# Patient Record
Sex: Female | Born: 1945 | Race: White | Smoking: Former smoker
Health system: Southern US, Community
[De-identification: ages and names within clinical notes are randomized; demographics above are authoritative.]

## PROBLEM LIST (undated history)

## (undated) DIAGNOSIS — N816 Rectocele: Secondary | ICD-10-CM

## (undated) DIAGNOSIS — L0232 Furuncle of buttock: Secondary | ICD-10-CM

## (undated) DIAGNOSIS — T8859XA Other complications of anesthesia, initial encounter: Secondary | ICD-10-CM

## (undated) DIAGNOSIS — Z972 Presence of dental prosthetic device (complete) (partial): Secondary | ICD-10-CM

## (undated) DIAGNOSIS — I10 Essential (primary) hypertension: Secondary | ICD-10-CM

## (undated) DIAGNOSIS — M545 Other chronic pain: Secondary | ICD-10-CM

## (undated) DIAGNOSIS — R7303 Prediabetes: Secondary | ICD-10-CM

## (undated) DIAGNOSIS — T4145XA Adverse effect of unspecified anesthetic, initial encounter: Secondary | ICD-10-CM

## (undated) DIAGNOSIS — K08109 Complete loss of teeth, unspecified cause, unspecified class: Secondary | ICD-10-CM

## (undated) DIAGNOSIS — G609 Hereditary and idiopathic neuropathy, unspecified: Secondary | ICD-10-CM

## (undated) DIAGNOSIS — K449 Diaphragmatic hernia without obstruction or gangrene: Secondary | ICD-10-CM

## (undated) DIAGNOSIS — G8929 Other chronic pain: Secondary | ICD-10-CM

## (undated) DIAGNOSIS — Z8711 Personal history of peptic ulcer disease: Secondary | ICD-10-CM

## (undated) DIAGNOSIS — M109 Gout, unspecified: Secondary | ICD-10-CM

## (undated) DIAGNOSIS — Z8719 Personal history of other diseases of the digestive system: Secondary | ICD-10-CM

## (undated) DIAGNOSIS — K219 Gastro-esophageal reflux disease without esophagitis: Secondary | ICD-10-CM

## (undated) DIAGNOSIS — M1990 Unspecified osteoarthritis, unspecified site: Secondary | ICD-10-CM

## (undated) DIAGNOSIS — Z96659 Presence of unspecified artificial knee joint: Secondary | ICD-10-CM

## (undated) DIAGNOSIS — Z8679 Personal history of other diseases of the circulatory system: Secondary | ICD-10-CM

## (undated) HISTORY — PX: BREAST ENHANCEMENT SURGERY: SHX7

## (undated) HISTORY — PX: CARDIAC CATHETERIZATION: SHX172

## (undated) HISTORY — PX: TONSILLECTOMY: SUR1361

## (undated) HISTORY — PX: TUBAL LIGATION: SHX77

## (undated) HISTORY — PX: OTHER SURGICAL HISTORY: SHX169

## (undated) HISTORY — PX: HERNIA REPAIR: SHX51

## (undated) HISTORY — PX: TOTAL KNEE ARTHROPLASTY: SHX125

## (undated) HISTORY — PX: NASAL SINUS SURGERY: SHX719

## (undated) HISTORY — DX: Presence of unspecified artificial knee joint: Z96.659

## (undated) HISTORY — PX: CHOLECYSTECTOMY: SHX55

## (undated) HISTORY — PX: ROTATOR CUFF REPAIR: SHX139

## (undated) HISTORY — PX: VESICOVAGINAL FISTULA CLOSURE W/ TAH: SUR271

## (undated) HISTORY — PX: VAGINAL HYSTERECTOMY: SUR661

## (undated) HISTORY — PX: KNEE ARTHROSCOPY: SUR90

## (undated) HISTORY — PX: BLADDER SURGERY: SHX569

---
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
## 1968-01-18 HISTORY — PX: APPENDECTOMY: SHX54

## 1974-01-17 HISTORY — PX: CHOLECYSTECTOMY OPEN: SUR202

## 1983-01-18 HISTORY — PX: NASAL SINUS SURGERY: SHX719

## 1994-01-17 HISTORY — PX: UMBILICAL HERNIA REPAIR: SHX196

## 1997-01-17 HISTORY — PX: COMBINED AUGMENTATION MAMMAPLASTY AND ABDOMINOPLASTY: SUR291

## 2009-01-17 HISTORY — PX: BLADDER SURGERY: SHX569

## 2011-01-18 HISTORY — PX: INCONTINENCE SURGERY: SHX676

## 2013-09-25 DIAGNOSIS — K5900 Constipation, unspecified: Secondary | ICD-10-CM | POA: Insufficient documentation

## 2013-09-25 DIAGNOSIS — R079 Chest pain, unspecified: Secondary | ICD-10-CM | POA: Insufficient documentation

## 2013-09-25 DIAGNOSIS — M25519 Pain in unspecified shoulder: Secondary | ICD-10-CM | POA: Insufficient documentation

## 2013-09-25 DIAGNOSIS — IMO0001 Reserved for inherently not codable concepts without codable children: Secondary | ICD-10-CM | POA: Insufficient documentation

## 2013-09-25 DIAGNOSIS — E785 Hyperlipidemia, unspecified: Secondary | ICD-10-CM | POA: Insufficient documentation

## 2013-09-25 DIAGNOSIS — I2510 Atherosclerotic heart disease of native coronary artery without angina pectoris: Secondary | ICD-10-CM | POA: Insufficient documentation

## 2013-09-25 DIAGNOSIS — L509 Urticaria, unspecified: Secondary | ICD-10-CM | POA: Insufficient documentation

## 2013-09-25 DIAGNOSIS — K219 Gastro-esophageal reflux disease without esophagitis: Secondary | ICD-10-CM | POA: Insufficient documentation

## 2013-09-25 DIAGNOSIS — K589 Irritable bowel syndrome without diarrhea: Secondary | ICD-10-CM | POA: Insufficient documentation

## 2013-09-25 DIAGNOSIS — M1990 Unspecified osteoarthritis, unspecified site: Secondary | ICD-10-CM | POA: Insufficient documentation

## 2013-09-25 DIAGNOSIS — R197 Diarrhea, unspecified: Secondary | ICD-10-CM | POA: Insufficient documentation

## 2013-09-25 DIAGNOSIS — R159 Full incontinence of feces: Secondary | ICD-10-CM | POA: Insufficient documentation

## 2013-09-25 DIAGNOSIS — R7303 Prediabetes: Secondary | ICD-10-CM | POA: Insufficient documentation

## 2013-09-25 DIAGNOSIS — M25569 Pain in unspecified knee: Secondary | ICD-10-CM | POA: Insufficient documentation

## 2013-09-25 DIAGNOSIS — E663 Overweight: Secondary | ICD-10-CM | POA: Insufficient documentation

## 2013-09-25 DIAGNOSIS — M79603 Pain in arm, unspecified: Secondary | ICD-10-CM | POA: Insufficient documentation

## 2013-09-25 DIAGNOSIS — F32A Depression, unspecified: Secondary | ICD-10-CM | POA: Insufficient documentation

## 2013-09-25 DIAGNOSIS — E669 Obesity, unspecified: Secondary | ICD-10-CM | POA: Insufficient documentation

## 2013-09-25 DIAGNOSIS — N3281 Overactive bladder: Secondary | ICD-10-CM | POA: Insufficient documentation

## 2013-09-25 DIAGNOSIS — F329 Major depressive disorder, single episode, unspecified: Secondary | ICD-10-CM | POA: Insufficient documentation

## 2014-10-30 DIAGNOSIS — S83209A Unspecified tear of unspecified meniscus, current injury, unspecified knee, initial encounter: Secondary | ICD-10-CM | POA: Insufficient documentation

## 2014-10-30 DIAGNOSIS — Z01818 Encounter for other preprocedural examination: Secondary | ICD-10-CM | POA: Insufficient documentation

## 2014-11-18 DIAGNOSIS — Z4889 Encounter for other specified surgical aftercare: Secondary | ICD-10-CM | POA: Insufficient documentation

## 2015-03-09 ENCOUNTER — Encounter: Payer: Self-pay | Admitting: Internal Medicine

## 2015-03-09 ENCOUNTER — Ambulatory Visit (INDEPENDENT_AMBULATORY_CARE_PROVIDER_SITE_OTHER): Payer: Medicare Other | Admitting: Internal Medicine

## 2015-03-09 VITALS — BP 110/64 | HR 94 | Temp 97.4°F | Resp 20 | Ht 66.0 in | Wt 219.8 lb

## 2015-03-09 DIAGNOSIS — R21 Rash and other nonspecific skin eruption: Secondary | ICD-10-CM | POA: Diagnosis not present

## 2015-03-09 DIAGNOSIS — L500 Allergic urticaria: Secondary | ICD-10-CM | POA: Diagnosis not present

## 2015-03-09 MED ORDER — NYSTATIN 100000 UNIT/GM EX CREA: TOPICAL_CREAM | CUTANEOUS | Status: DC

## 2015-03-09 NOTE — Assessment & Plan Note (Signed)
   Possible metal allergy  Patch test placed today.  Please return in 48 and 96 hours for readings.  No antihistamines during test.

## 2015-03-09 NOTE — Progress Notes (Signed)
Referring provider: Gwenith Daily. Wyline Mood, MD [address] [facility] [address], [place] [address]  History of Present Illness:  Morgan Pearson is a 70 y.o. female seen in consultation at the kind request of Dr. Wyline Mood for possible metal allergy  HPI Comments: Possible metal allergy: Patient is scheduled to undergo total knee replacement in the near future. In the past, contact with nickel jewelry has caused an erythematous pruritic rash in the area of contact. She has undergone left knee replacement in 2010 without any problems but she does not know what type of metal was in the implant. Last October, she underwent knee arthroscopic 8 and developed erythema and edema in the area of the incision. She was told that it was due to infection. Fluid was drained and symptoms eventually resolved. Of note, patient has also undergone dental implants without any problems.   No current outpatient prescriptions on file prior to visit.   No current facility-administered medications on file prior to visit.    Assessment and Plan: Allergic urticaria  Possible metal allergy  Patch test placed today.  Please return in 48 and 96 hours for readings.  No antihistamines during test.  Rash  Start nystatin cream. Apply 3 times a day to affected area for 7-10 days    Return in about 2 days (around 03/11/2015).  Meds ordered this encounter  Medications  . atorvastatin (LIPITOR) 40 MG tablet    Sig: Take 40 mg by mouth.  Marland Kitchen aspirin 325 MG tablet    Sig: Take 81 mg by mouth.  . cyclobenzaprine (FLEXERIL) 10 MG tablet    Sig: Take 10 mg by mouth.  . diclofenac (VOLTAREN) 75 MG EC tablet    Sig:   . Vitamin D, Ergocalciferol, (DRISDOL) 50000 units CAPS capsule    Sig: Take by mouth.  Marland Kitchen FLUoxetine (PROZAC) 20 MG capsule    Sig:   . LORazepam (ATIVAN) 1 MG tablet    Sig:   . omeprazole (PRILOSEC) 40 MG capsule    Sig: Take 40 mg by mouth.  . oxyCODONE-acetaminophen  (PERCOCET/ROXICET) 5-325 MG tablet    Sig: Take by mouth.  . phentermine (ADIPEX-P) 37.5 MG tablet    Sig:   . triamterene-hydrochlorothiazide (MAXZIDE-25) 37.5-25 MG tablet    Sig:   . etodolac (LODINE) 400 MG tablet    Sig:   . fluticasone (FLONASE) 50 MCG/ACT nasal spray    Sig:   . nystatin cream (MYCOSTATIN)    Sig: APPLY TO AFFECTED AREAS THREE TIMES A DAY FOR 7 TO 10 DAYS.    Dispense:  30 g    Refill:  0    Physical Exam: BP 110/64 mmHg  Pulse 94  Temp(Src) 97.4 F (36.3 C) (Oral)  Resp 20  Ht  (1.676 m)  Wt 219 lb 12.8 oz (99.701 kg)  BMI 35.49 kg/m2   Physical Exam  Constitutional: She appears well-developed and well-nourished. No distress.  HENT:  Right Ear: External ear normal.  Left Ear: External ear normal.  Nose: Nose normal.  Mouth/Throat: Oropharynx is clear and moist.  Eyes: Conjunctivae are normal. Right eye exhibits no discharge. Left eye exhibits no discharge.  Cardiovascular: Normal rate, regular rhythm and normal heart sounds.   No murmur heard. Pulmonary/Chest: Effort normal and breath sounds normal. No respiratory distress. She has no wheezes. She has no rales.  Abdominal: Soft. Bowel sounds are normal.  Musculoskeletal: She exhibits no edema.  Lymphadenopathy:    She has no  cervical adenopathy.  Neurological: She is alert.  Skin: Rash (erythematous moist-looking rash on her left axillary region) noted.  Vitals reviewed.   Review of systems: Per HPI unless specifically indicated below Review of Systems  Constitutional: Negative for fever, chills, appetite change and unexpected weight change.  HENT: Negative for congestion, ear pain, postnasal drip, rhinorrhea, sinus pressure, sneezing and sore throat.   Eyes: Negative for pain and itching.  Respiratory: Negative for cough, chest tightness and wheezing.   Cardiovascular: Negative for chest pain and leg swelling.  Gastrointestinal: Negative for vomiting and diarrhea.  Genitourinary:  Negative for difficulty urinating.  Musculoskeletal: Positive for arthralgias. Negative for joint swelling.  Skin: Positive for rash.  Allergic/Immunologic: Negative for environmental allergies, food allergies and immunocompromised state.       No hymenoptera stings No latex allergy  Neurological: Negative for seizures.    Past medical history:  Patient Active Problem List   Diagnosis Date Noted  . Allergic urticaria 03/09/2015  . Rash 03/09/2015  . Encounter for surgical follow-up care 11/18/2014  . Knee torn cartilage 10/30/2014  . Encounter for other preprocedural examination 10/30/2014  . Arm pain 09/25/2013  . Abnormal uterine contraction 09/25/2013  . Chest pain 09/25/2013  . CN (constipation) 09/25/2013  . Arteriosclerosis of coronary artery 09/25/2013  . Clinical depression 09/25/2013  . 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 D (diarrhea) 09/25/2013  . Can't get food down 09/25/2013  . Encopresis 09/25/2013  . Acid reflux 09/25/2013  . HLD (hyperlipidemia) 09/25/2013  . Adaptive colitis 09/25/2013  . Gonalgia 09/25/2013  . Arthralgia of shoulder 09/25/2013  . Adiposity 09/25/2013  . Arthritis, degenerative 09/25/2013  . Excess weight 09/25/2013  . Borderline diabetes mellitus 09/25/2013  . Hive 09/25/2013    Past surgical history: Past Surgical History  Procedure Laterality Date  . Knee arthroscopy    . Total knee      Family history: Family History  Problem Relation Age of Onset  . Allergic rhinitis Neg Hx   . Angioedema Neg Hx   . Asthma Neg Hx   . Eczema Neg Hx   . Immunodeficiency Neg Hx   . Urticaria Neg Hx     Environmental/Social history: She lives in a house that is 70 years of age she has a non-feather pillow and comforter, she has central air conditioning and heating, there are no pets in the home, there is none finished basement in the home, she smoked less than a pack per day for 20 years but quit 30 years ago, she is retired.  Drug  Allergies:  Allergies  Allergen Reactions  . Other     TAPE 1"X5YD TAPE  . Tape Dermatitis    Thank you for the opportunity to care for this patient.  Please do not hesitate to contact me with questions.

## 2015-03-09 NOTE — Patient Instructions (Addendum)
Allergic urticaria  Possible metal allergy  Patch test placed today.  Please return in 48 and 96 hours for readings.  No antihistamines during test.  Rash  Start nystatin cream. Apply 3 times a day to affected area for 7-10 days

## 2015-03-09 NOTE — Assessment & Plan Note (Signed)
   Start nystatin cream. Apply 3 times a day to affected area for 7-10 days

## 2015-03-11 ENCOUNTER — Encounter: Payer: Medicare Other | Admitting: Internal Medicine

## 2015-03-11 NOTE — Progress Notes (Signed)
This encounter was created in error - please disregard.

## 2015-03-13 ENCOUNTER — Encounter: Payer: Medicare Other | Admitting: Internal Medicine

## 2015-03-13 NOTE — Progress Notes (Signed)
Patient presented at 48 hours and 96 hours for readings of her patch test. They were all negative

## 2015-03-16 ENCOUNTER — Encounter: Payer: Self-pay | Admitting: *Deleted

## 2015-03-19 ENCOUNTER — Encounter: Payer: Self-pay | Admitting: *Deleted

## 2015-12-18 ENCOUNTER — Encounter (HOSPITAL_COMMUNITY): Payer: Self-pay | Admitting: Emergency Medicine

## 2015-12-18 ENCOUNTER — Emergency Department (HOSPITAL_COMMUNITY)
Admission: EM | Admit: 2015-12-18 | Discharge: 2015-12-19 | Disposition: A | Discharge status: Home / Self Care | Payer: Medicare Other | Attending: Emergency Medicine | Admitting: Emergency Medicine

## 2015-12-18 ENCOUNTER — Emergency Department (HOSPITAL_COMMUNITY): Payer: Medicare Other

## 2015-12-18 DIAGNOSIS — Z7982 Long term (current) use of aspirin: Secondary | ICD-10-CM | POA: Diagnosis not present

## 2015-12-18 DIAGNOSIS — M545 Low back pain, unspecified: Secondary | ICD-10-CM

## 2015-12-18 DIAGNOSIS — S50811A Abrasion of right forearm, initial encounter: Secondary | ICD-10-CM | POA: Diagnosis not present

## 2015-12-18 DIAGNOSIS — W1839XA Other fall on same level, initial encounter: Secondary | ICD-10-CM | POA: Diagnosis not present

## 2015-12-18 DIAGNOSIS — S5012XA Contusion of left forearm, initial encounter: Secondary | ICD-10-CM | POA: Diagnosis not present

## 2015-12-18 DIAGNOSIS — R51 Headache: Secondary | ICD-10-CM | POA: Diagnosis not present

## 2015-12-18 DIAGNOSIS — Y939 Activity, unspecified: Secondary | ICD-10-CM | POA: Diagnosis not present

## 2015-12-18 DIAGNOSIS — Z96659 Presence of unspecified artificial knee joint: Secondary | ICD-10-CM | POA: Insufficient documentation

## 2015-12-18 DIAGNOSIS — S40212A Abrasion of left shoulder, initial encounter: Secondary | ICD-10-CM | POA: Insufficient documentation

## 2015-12-18 DIAGNOSIS — Y999 Unspecified external cause status: Secondary | ICD-10-CM | POA: Insufficient documentation

## 2015-12-18 DIAGNOSIS — R0789 Other chest pain: Secondary | ICD-10-CM

## 2015-12-18 DIAGNOSIS — Y929 Unspecified place or not applicable: Secondary | ICD-10-CM | POA: Diagnosis not present

## 2015-12-18 DIAGNOSIS — Z87891 Personal history of nicotine dependence: Secondary | ICD-10-CM | POA: Insufficient documentation

## 2015-12-18 DIAGNOSIS — Z79899 Other long term (current) drug therapy: Secondary | ICD-10-CM | POA: Diagnosis not present

## 2015-12-18 DIAGNOSIS — R519 Headache, unspecified: Secondary | ICD-10-CM

## 2015-12-18 DIAGNOSIS — W19XXXA Unspecified fall, initial encounter: Secondary | ICD-10-CM

## 2015-12-18 DIAGNOSIS — S59912A Unspecified injury of left forearm, initial encounter: Secondary | ICD-10-CM | POA: Diagnosis present

## 2015-12-18 DIAGNOSIS — M25512 Pain in left shoulder: Secondary | ICD-10-CM

## 2015-12-18 LAB — I-STAT CHEM 8, ED
BUN: 17 mg/dL (ref 6–20)
CALCIUM ION: 1.17 mmol/L (ref 1.15–1.40)
Chloride: 103 mmol/L (ref 101–111)
Creatinine, Ser: 1.5 mg/dL — ABNORMAL HIGH (ref 0.44–1.00)
GLUCOSE: 113 mg/dL — AB (ref 65–99)
HCT: 36 % (ref 36.0–46.0)
HEMOGLOBIN: 12.2 g/dL (ref 12.0–15.0)
Potassium: 4 mmol/L (ref 3.5–5.1)
SODIUM: 143 mmol/L (ref 135–145)
TCO2: 27 mmol/L (ref 0–100)

## 2015-12-18 NOTE — ED Triage Notes (Signed)
Per Pt, from UC, sent here for head CT due to fall with Pos LOC. Pt not on blood thinners. Had xray done of her L forearm which was negative. Pt is ambulatory without any distress. C/o slight nausea. Headache 8/10.

## 2015-12-19 ENCOUNTER — Emergency Department (HOSPITAL_COMMUNITY): Payer: Medicare Other

## 2015-12-19 MED ORDER — CYCLOBENZAPRINE HCL 5 MG PO TABS: 5.0000 mg | ORAL_TABLET | Freq: Three times a day (TID) | ORAL | 0 refills | Status: DC | PRN

## 2015-12-19 MED ORDER — ONDANSETRON 4 MG PO TBDP: 4.0000 mg | ORAL_TABLET | Freq: Three times a day (TID) | ORAL | 0 refills | Status: DC | PRN

## 2015-12-19 MED ORDER — ACETAMINOPHEN 500 MG PO TABS
1000.0000 mg | ORAL_TABLET | Freq: Once | ORAL | Status: AC
  Administered 2015-12-19: 1000 mg via ORAL

## 2015-12-19 MED ORDER — ACETAMINOPHEN 500 MG PO TABS
ORAL_TABLET | ORAL | Status: AC
  Filled 2015-12-19: qty 2

## 2015-12-19 NOTE — ED Provider Notes (Signed)
[facility]-EMERGENCY DEPT Provider Note   CSN: [id number] Arrival date & time: 12/18/15  2123  By signing my name below, I, Suzan SlickAshley N. Elon SpannerLeger, attest that this documentation has been prepared under the direction and in the presence of Alvira MondayErin Lilo Wallington, MD.  Electronically Signed: Suzan SlickAshley N. Elon SpannerLeger, ED Scribe. 12/19/15. 1:28 AM.    History   Chief Complaint Chief Complaint  Patient presents with  . Fall  . Loss of Consciousness   The history is provided by the patient. No language interpreter was used.    HPI Comments: Breck CoonsVictoria Borgerding is a 70 y.o. female who presents to the Emergency Department here after a fall sustained at approximately 4:30 PM. Pt states she stumbled, lost her balance resulting in her hitting the cement. She admits to an episode of LOC at time of fall but states she is unsure how long she was down. She now reports constant, ongoing HA, L forearm pain arm pain, lower back pain, chest wall pain, and nausea. Currently pain is rated 7/10. Pt reports ongoing gait issues x 2 weeks as she states she has often felt off balance. She also reports numbness to toes bilaterally. OTC ASA attempted at home without any long term improvement. Denies any recent fever, chills, vomiting, chest pain, or shortness of breath. Pt was initially evaluated at Urgent Care and was advised to come to the Emergency Department for further evaluation.  PCP: Karle PlumberARVIND,MOOGALI M, MD   Past Medical History:  Diagnosis Date  . Total knee replacement status     Patient Active Problem List   Diagnosis Date Noted  . Allergic urticaria 03/09/2015  . Rash 03/09/2015  . Encounter for surgical follow-up care 11/18/2014  . Knee torn cartilage 10/30/2014  . Encounter for other preprocedural examination 10/30/2014  . Arm pain 09/25/2013  . Abnormal uterine contraction 09/25/2013  . Chest pain 09/25/2013  . CN (constipation) 09/25/2013  . Arteriosclerosis of coronary artery 09/25/2013  . Clinical depression  09/25/2013  . 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 D (diarrhea) 09/25/2013  . Can't get food down 09/25/2013  . Encopresis 09/25/2013  . Acid reflux 09/25/2013  . HLD (hyperlipidemia) 09/25/2013  . Adaptive colitis 09/25/2013  . Gonalgia 09/25/2013  . Arthralgia of shoulder 09/25/2013  . Adiposity 09/25/2013  . Arthritis, degenerative 09/25/2013  . Excess weight 09/25/2013  . Borderline diabetes mellitus 09/25/2013  . Hive 09/25/2013    Past Surgical History:  Procedure Laterality Date  . BLADDER SURGERY    . BREAST ENHANCEMENT SURGERY    . CHOLECYSTECTOMY    . HERNIA REPAIR    . KNEE ARTHROSCOPY    . LAZER SURGERY    . NASAL SINUS SURGERY    . ROTATOR CUFF REPAIR    . total knee    . TUMMY TUCK    . VESICOVAGINAL FISTULA CLOSURE W/ TAH      OB History    No data available       Home Medications    Prior to Admission medications   Medication Sig Start Date End Date Taking? Authorizing Provider  aspirin EC 81 MG tablet Take 81 mg by mouth daily.   Yes Historical Provider, MD  atorvastatin (LIPITOR) 40 MG tablet Take 40 mg by mouth daily.    Yes Historical Provider, MD  Cholecalciferol (VITAMIN D PO) Take 1 tablet by mouth daily.   Yes Historical Provider, MD  FLUoxetine (PROZAC) 20 MG capsule Take 20 mg by mouth daily.  09/26/14  Yes Historical Provider, MD  HYDROcodone-acetaminophen (  NORCO/VICODIN) 5-325 MG tablet Take 1 tablet by mouth every 6 (six) hours as needed for moderate pain.  09/28/15  Yes Historical Provider, MD  omeprazole (PRILOSEC) 40 MG capsule Take 40 mg by mouth daily.    Yes Historical Provider, MD  triamterene-hydrochlorothiazide (MAXZIDE-25) 37.5-25 MG tablet Take 1 tablet by mouth daily.  12/21/14  Yes Historical Provider, MD  cyclobenzaprine (FLEXERIL) 5 MG tablet Take 1 tablet (5 mg total) by mouth 3 (three) times daily as needed for muscle spasms. 12/19/15   Alvira Monday, MD  nystatin cream (MYCOSTATIN) APPLY TO AFFECTED AREAS THREE TIMES A DAY  FOR 7 TO 10 DAYS. Patient not taking: Reported on 12/19/2015 03/09/15   Mikki Santee, MD  ondansetron (ZOFRAN ODT) 4 MG disintegrating tablet Take 1 tablet (4 mg total) by mouth every 8 (eight) hours as needed for nausea or vomiting. 12/19/15   Alvira Monday, MD    Family History Family History  Problem Relation Age of Onset  . Allergic rhinitis Neg Hx   . Angioedema Neg Hx   . Asthma Neg Hx   . Eczema Neg Hx   . Immunodeficiency Neg Hx   . Urticaria Neg Hx     Social History Social History  Substance Use Topics  . Smoking status: Former Smoker    Types: Cigarettes    Quit date: 03/08/1985  . Smokeless tobacco: Never Used  . Alcohol use No     Allergies   Other and Tape   Review of Systems Review of Systems  Constitutional: Negative for chills and fever.  HENT: Negative for sore throat.   Eyes: Negative for visual disturbance.  Respiratory: Negative for cough and shortness of breath.   Cardiovascular: Negative for chest pain.  Gastrointestinal: Positive for nausea. Negative for abdominal pain and vomiting.  Genitourinary: Negative for difficulty urinating.  Musculoskeletal: Positive for arthralgias and back pain. Negative for neck pain.  Skin: Positive for wound. Negative for rash.  Neurological: Positive for numbness and headaches. Negative for syncope, facial asymmetry, speech difficulty and weakness.     Physical Exam Updated Vital Signs BP 119/60 (BP Location: Right Arm) Comment: Simultaneous filing. User may not have seen previous data.  Pulse 69 Comment: Simultaneous filing. User may not have seen previous data.  Temp 98.7 F (37.1 C) (Oral)   Resp 16   Ht 5\' 6"  (1.676 m)   Wt 218 lb (98.9 kg)   SpO2 97% Comment: Simultaneous filing. User may not have seen previous data.  BMI 35.19 kg/m   Physical Exam  Constitutional: She is oriented to person, place, and time. She appears well-developed and well-nourished. No distress.  HENT:  Head: Normocephalic  and atraumatic.  Eyes: Conjunctivae and EOM are normal.  Neck: Normal range of motion.  Cardiovascular: Normal rate, regular rhythm, normal heart sounds and intact distal pulses.  Exam reveals no gallop and no friction rub.   No murmur heard. Pulmonary/Chest: Effort normal and breath sounds normal. No respiratory distress. She has no wheezes. She has no rales. She exhibits tenderness.  Abdominal: Soft. She exhibits no distension. There is no tenderness. There is no guarding.  Musculoskeletal: Normal range of motion. She exhibits tenderness. She exhibits no edema or deformity.  Abrasion over L shoulder. Contusion L forearm. 2+ pulses. Superficial scratch to the R forearm. No cervical, thoracic tenderness. Does report tenderness over lumbar spine  Neurological: She is alert and oriented to person, place, and time.  Altered sensation bilateral toes Normal gait Normal strength CN nerves  tested and normal Normal finger-nose  Skin: Skin is warm and dry. No rash noted. She is not diaphoretic. No erythema.  Psychiatric: She has a normal mood and affect. Judgment normal.  Nursing note and vitals reviewed.    ED Treatments / Results   DIAGNOSTIC STUDIES: Oxygen Saturation is 96% on RA, adequate by my interpretation.    COORDINATION OF CARE: 1:20 AM- Will order imaging and blood work. Discussed treatment plan with pt at bedside and pt agreed to plan.     Labs (all labs ordered are listed, but only abnormal results are displayed) Labs Reviewed  I-STAT CHEM 8, ED - Abnormal; Notable for the following:       Result Value   Creatinine, Ser 1.50 (*)    Glucose, Bld 113 (*)    All other components within normal limits    EKG  EKG Interpretation None       Radiology Dg Ribs Unilateral W/chest Left  Result Date: 12/19/2015 CLINICAL DATA:  Initial valuation for acute trauma, fall. Left upper anterior rib pain. EXAM: LEFT RIBS AND CHEST - 3+ VIEW COMPARISON:  Prior radiograph from  01/24/2014. FINDINGS: Cardiac and mediastinal silhouettes are stable in size and contour, and remain within normal limits. Lungs are normally inflated. No focal infiltrates. No pulmonary edema or pleural effusion. No pneumothorax. Mild diffuse bronchitic changes. Dedicated views the left ribs demonstrate no acute displaced rib fracture. No other acute osseous abnormality. Degenerative changes noted about the partially visualized shoulders. IMPRESSION: 1. No acute displaced rib fracture identified. 2. Mild diffuse bronchitic changes. No other active cardiopulmonary disease. Electronically Signed   By: Rise Mu M.D.   On: 12/19/2015 03:06   Dg Lumbar Spine Complete  Result Date: 12/19/2015 CLINICAL DATA:  Initial evaluation for acute trauma, fall. EXAM: LUMBAR SPINE - COMPLETE 4+ VIEW COMPARISON:  None. FINDINGS: Vertebral bodies normally aligned with preservation of the normal lumbar lordosis. Question mild height loss at the superior endplate of L3, favored to be chronic/degenerative in nature. Vertebral body heights are otherwise maintained. Moderate multilevel degenerative spondylolysis, greatest at L5-S1. Bilateral facet arthrosis noted throughout the lumbar spine. No acute soft tissue abnormality.  Aortic atherosclerosis noted. IMPRESSION: 1. Mild height loss at the superior endplate of L3, favored to be chronic/degenerative in nature. Correlation with physical exam for possible pain at this location recommended. Finding could be further assessed with dedicated cross-sectional imaging of the lumbar spine as indicated. 2. No other radiographic evidence for acute traumatic injury within the lumbar spine. 3. Moderate multilevel degenerative spondylolysis, greatest at L5-S1. Electronically Signed   By: Rise Mu M.D.   On: 12/19/2015 03:17   Ct Head Wo Contrast  Result Date: 12/18/2015 CLINICAL DATA:  Initial evaluation for acute trauma, fall. EXAM: CT HEAD WITHOUT CONTRAST CT CERVICAL  SPINE WITHOUT CONTRAST TECHNIQUE: Multidetector CT imaging of the head and cervical spine was performed following the standard protocol without intravenous contrast. Multiplanar CT image reconstructions of the cervical spine were also generated. COMPARISON:  Prior CT from 01/24/2014. FINDINGS: CT HEAD FINDINGS Brain: Mild age-related cerebral atrophy with chronic microvascular ischemic disease. Probable small remote lacunar infarcts noted within the bilateral basal ganglia. No acute intracranial hemorrhage. No findings to suggest acute large vessel territory infarct. No mass lesion, midline shift, or mass effect. No hydrocephalus. No extra-axial fluid collection. Vascular: Scattered vascular calcifications noted within the carotid siphons. No hyperdense vessel. Skull: Small left periorbital contusion noted. No acute calvarial fracture. Sinuses/Orbits: Globes and orbital soft tissues within  normal limits. Paranasal sinuses are clear. No mastoid effusion. CT CERVICAL SPINE FINDINGS Alignment: Vertebral bodies normally aligned with preservation of the normal cervical lordosis. No listhesis is subluxation. Skull base and vertebrae: Skullbase intact. Normal C1-2 articulations preserved. Dens is intact. Vertebral body heights maintained. No acute fracture. Soft tissues and spinal canal: Visualized soft tissues of the neck demonstrate no acute abnormality. No prevertebral soft tissue swelling. Spinal canal widely patent. Disc levels: Moderate degenerative spondylolysis present at C5-6. Degenerative changes noted about the anterior C1-2 articulation. Upper chest: Visualized upper mediastinum within normal limits. Visualized lung apices are clear. No apical pneumothorax. IMPRESSION: CT BRAIN: 1. No acute intracranial process. 2. Mild age-related cerebral atrophy with chronic small vessel ischemic disease. CT CERVICAL SPINE: 1. No acute traumatic injury within the cervical spine. 2. Moderate degenerative spondylolysis at  C5-6. Electronically Signed   By: Rise MuBenjamin  McClintock M.D.   On: 12/18/2015 22:57   Ct Cervical Spine Wo Contrast  Result Date: 12/18/2015 CLINICAL DATA:  Initial evaluation for acute trauma, fall. EXAM: CT HEAD WITHOUT CONTRAST CT CERVICAL SPINE WITHOUT CONTRAST TECHNIQUE: Multidetector CT imaging of the head and cervical spine was performed following the standard protocol without intravenous contrast. Multiplanar CT image reconstructions of the cervical spine were also generated. COMPARISON:  Prior CT from 01/24/2014. FINDINGS: CT HEAD FINDINGS Brain: Mild age-related cerebral atrophy with chronic microvascular ischemic disease. Probable small remote lacunar infarcts noted within the bilateral basal ganglia. No acute intracranial hemorrhage. No findings to suggest acute large vessel territory infarct. No mass lesion, midline shift, or mass effect. No hydrocephalus. No extra-axial fluid collection. Vascular: Scattered vascular calcifications noted within the carotid siphons. No hyperdense vessel. Skull: Small left periorbital contusion noted. No acute calvarial fracture. Sinuses/Orbits: Globes and orbital soft tissues within normal limits. Paranasal sinuses are clear. No mastoid effusion. CT CERVICAL SPINE FINDINGS Alignment: Vertebral bodies normally aligned with preservation of the normal cervical lordosis. No listhesis is subluxation. Skull base and vertebrae: Skullbase intact. Normal C1-2 articulations preserved. Dens is intact. Vertebral body heights maintained. No acute fracture. Soft tissues and spinal canal: Visualized soft tissues of the neck demonstrate no acute abnormality. No prevertebral soft tissue swelling. Spinal canal widely patent. Disc levels: Moderate degenerative spondylolysis present at C5-6. Degenerative changes noted about the anterior C1-2 articulation. Upper chest: Visualized upper mediastinum within normal limits. Visualized lung apices are clear. No apical pneumothorax. IMPRESSION:  CT BRAIN: 1. No acute intracranial process. 2. Mild age-related cerebral atrophy with chronic small vessel ischemic disease. CT CERVICAL SPINE: 1. No acute traumatic injury within the cervical spine. 2. Moderate degenerative spondylolysis at C5-6. Electronically Signed   By: Rise MuBenjamin  McClintock M.D.   On: 12/18/2015 22:57   Dg Shoulder Left  Result Date: 12/19/2015 CLINICAL DATA:  Initial evaluation for acute trauma, fall. EXAM: LEFT SHOULDER - 2+ VIEW COMPARISON:  None. FINDINGS: No acute fracture dislocation. Humeral head somewhat high-riding relative to the glenoid, suggestive of underlying rotator cuff pathology. Advanced degenerative changes noted about the glenohumeral joint. AC joint grossly approximated. No acute soft tissue abnormality. Partially calcified left breast implant noted. IMPRESSION: 1. No acute fracture or dislocation. 2. 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 Electronically Signed   By: Rise MuBenjamin  McClintock M.D.   On: 12/19/2015 03:08    Procedures Procedures (including critical care time)  Medications Ordered in ED Medications  acetaminophen (TYLENOL) tablet 1,000 mg (1,000 mg Oral Given 12/19/15 0146)     Initial Impression / Assessment and Plan / ED Course  I have reviewed the triage vital signs and the nursing notes.  Pertinent labs & imaging results that were available during my care of the patient were reviewed by me and considered in my medical decision making (see chart for details).  Clinical Course    70yo female presents with concern for mechanical fall from standing with headache, nausea, back pain.  CT head/CSpine without abnormalities. Chest tenderness on exam, and chest and shoulder XR without acute abnormalities. Discussed possibility of occult fx of ribs however low suspicion. Lumbar spine with slight height loss which may be chronic or acute fx.  Given this finding and tenderness, feel it may represent acute fx. Discussed  possibility of brace in ED however fx not unstable, pt stable for discharge without brace and would like to go home and follow up with spine doctor.  Given rx for flexeril and zofran. Patient discharged in stable condition with understanding of reasons to return.   Final Clinical Impressions(s) / ED Diagnoses   Final diagnoses:  Fall, initial encounter  Acute nonintractable headache, unspecified headache type  Chest wall pain  Acute pain of left shoulder  Contusion of left forearm, initial encounter  Acute left-sided low back pain without sciatica    New Prescriptions Discharge Medication List as of 12/19/2015  4:09 AM    START taking these medications   Details  cyclobenzaprine (FLEXERIL) 5 MG tablet Take 1 tablet (5 mg total) by mouth 3 (three) times daily as needed for muscle spasms., Starting Sat 12/19/2015, Print    ondansetron (ZOFRAN ODT) 4 MG disintegrating tablet Take 1 tablet (4 mg total) by mouth every 8 (eight) hours as needed for nausea or vomiting., Starting Sat 12/19/2015, Print       I personally performed the services described in this documentation, which was scribed in my presence. The recorded information has been reviewed and is accurate.    Alvira Monday, MD 12/19/15 [phone]

## 2015-12-19 NOTE — ED Notes (Signed)
Taken to xray at this time. 

## 2016-01-18 HISTORY — PX: CATARACT EXTRACTION W/ INTRAOCULAR LENS  IMPLANT, BILATERAL: SHX1307

## 2018-04-18 ENCOUNTER — Ambulatory Visit (HOSPITAL_BASED_OUTPATIENT_CLINIC_OR_DEPARTMENT_OTHER): Admit: 2018-04-18 | Payer: Medicare Other | Admitting: Obstetrics

## 2018-04-18 ENCOUNTER — Encounter (HOSPITAL_BASED_OUTPATIENT_CLINIC_OR_DEPARTMENT_OTHER): Payer: Self-pay

## 2018-04-18 SURGERY — COLPORRHAPHY, POSTERIOR, FOR RECTOCELE REPAIR
Anesthesia: General

## 2018-06-26 ENCOUNTER — Other Ambulatory Visit: Payer: Self-pay | Admitting: Obstetrics

## 2018-07-07 ENCOUNTER — Other Ambulatory Visit (HOSPITAL_COMMUNITY)
Admission: RE | Admit: 2018-07-07 | Discharge: 2018-07-07 | Disposition: A | Discharge status: Home / Self Care | Payer: Medicare Other | Source: Ambulatory Visit | Attending: Obstetrics | Admitting: Obstetrics

## 2018-07-07 DIAGNOSIS — Z1159 Encounter for screening for other viral diseases: Secondary | ICD-10-CM | POA: Insufficient documentation

## 2018-07-07 LAB — SARS CORONAVIRUS 2 (TAT 6-24 HRS): SARS Coronavirus 2: NEGATIVE

## 2018-07-09 ENCOUNTER — Other Ambulatory Visit: Payer: Self-pay

## 2018-07-09 ENCOUNTER — Encounter (HOSPITAL_BASED_OUTPATIENT_CLINIC_OR_DEPARTMENT_OTHER): Payer: Self-pay | Admitting: *Deleted

## 2018-07-09 NOTE — Progress Notes (Signed)

## 2018-07-09 NOTE — Progress Notes (Addendum)
Spoke w/ pt via phone for pre-op interview.  Npo after mn w/ exception clear liquids until 0700 thing nothing by mouth.  Arrive at 1115.  Needs cbc, bmp, t&s, and ekg.  Pt had covid test done Saturday 07-07-2018.  Will take allopurinol, prilosec, and prozac am dos w/ sips of water.  Reviewed [facility] guidelines and visitor restriction policy. Pt to bring home medications.  Chart to be reviewed by anesthesia, Konrad Felix PA.  ADDENDUM:  Per anesthesia, Janett Billow, ok to proceed since is followed by pcp and pt denies any chest pain since 2016.

## 2018-07-10 ENCOUNTER — Encounter (HOSPITAL_BASED_OUTPATIENT_CLINIC_OR_DEPARTMENT_OTHER): Payer: Self-pay | Admitting: *Deleted

## 2018-07-11 ENCOUNTER — Observation Stay (HOSPITAL_BASED_OUTPATIENT_CLINIC_OR_DEPARTMENT_OTHER)
Admission: RE | Admit: 2018-07-11 | Discharge: 2018-07-12 | Disposition: A | Discharge status: Home / Self Care | Payer: Medicare Other | Attending: Obstetrics | Admitting: Obstetrics

## 2018-07-11 ENCOUNTER — Encounter (HOSPITAL_BASED_OUTPATIENT_CLINIC_OR_DEPARTMENT_OTHER)
Admission: RE | Disposition: A | Discharge status: Home / Self Care | Payer: Self-pay | Source: Home / Self Care | Attending: Obstetrics

## 2018-07-11 ENCOUNTER — Other Ambulatory Visit: Payer: Self-pay

## 2018-07-11 ENCOUNTER — Ambulatory Visit (HOSPITAL_BASED_OUTPATIENT_CLINIC_OR_DEPARTMENT_OTHER): Payer: Medicare Other | Admitting: Anesthesiology

## 2018-07-11 ENCOUNTER — Encounter (HOSPITAL_BASED_OUTPATIENT_CLINIC_OR_DEPARTMENT_OTHER): Payer: Self-pay | Admitting: Anesthesiology

## 2018-07-11 DIAGNOSIS — I2510 Atherosclerotic heart disease of native coronary artery without angina pectoris: Secondary | ICD-10-CM | POA: Insufficient documentation

## 2018-07-11 DIAGNOSIS — Z87891 Personal history of nicotine dependence: Secondary | ICD-10-CM | POA: Diagnosis not present

## 2018-07-11 DIAGNOSIS — R7303 Prediabetes: Secondary | ICD-10-CM | POA: Insufficient documentation

## 2018-07-11 DIAGNOSIS — Z791 Long term (current) use of non-steroidal anti-inflammatories (NSAID): Secondary | ICD-10-CM | POA: Diagnosis not present

## 2018-07-11 DIAGNOSIS — F329 Major depressive disorder, single episode, unspecified: Secondary | ICD-10-CM | POA: Insufficient documentation

## 2018-07-11 DIAGNOSIS — Z79899 Other long term (current) drug therapy: Secondary | ICD-10-CM | POA: Insufficient documentation

## 2018-07-11 DIAGNOSIS — G8929 Other chronic pain: Secondary | ICD-10-CM | POA: Insufficient documentation

## 2018-07-11 DIAGNOSIS — F1120 Opioid dependence, uncomplicated: Secondary | ICD-10-CM | POA: Insufficient documentation

## 2018-07-11 DIAGNOSIS — M545 Low back pain: Secondary | ICD-10-CM | POA: Diagnosis not present

## 2018-07-11 DIAGNOSIS — Z96653 Presence of artificial knee joint, bilateral: Secondary | ICD-10-CM | POA: Insufficient documentation

## 2018-07-11 DIAGNOSIS — F419 Anxiety disorder, unspecified: Secondary | ICD-10-CM | POA: Insufficient documentation

## 2018-07-11 DIAGNOSIS — I10 Essential (primary) hypertension: Secondary | ICD-10-CM | POA: Insufficient documentation

## 2018-07-11 DIAGNOSIS — M109 Gout, unspecified: Secondary | ICD-10-CM | POA: Insufficient documentation

## 2018-07-11 DIAGNOSIS — K219 Gastro-esophageal reflux disease without esophagitis: Secondary | ICD-10-CM | POA: Diagnosis not present

## 2018-07-11 DIAGNOSIS — N816 Rectocele: Secondary | ICD-10-CM | POA: Diagnosis not present

## 2018-07-11 DIAGNOSIS — Z90710 Acquired absence of both cervix and uterus: Secondary | ICD-10-CM | POA: Diagnosis not present

## 2018-07-11 HISTORY — DX: Unspecified osteoarthritis, unspecified site: M19.90

## 2018-07-11 HISTORY — DX: Other chronic pain: M54.50

## 2018-07-11 HISTORY — DX: Rectocele: N81.6

## 2018-07-11 HISTORY — DX: Other chronic pain: G89.29

## 2018-07-11 HISTORY — DX: Prediabetes: R73.03

## 2018-07-11 HISTORY — PX: RECTOCELE REPAIR: SHX761

## 2018-07-11 HISTORY — DX: Personal history of other diseases of the circulatory system: Z86.79

## 2018-07-11 HISTORY — DX: Essential (primary) hypertension: I10

## 2018-07-11 HISTORY — DX: Gastro-esophageal reflux disease without esophagitis: K21.9

## 2018-07-11 HISTORY — DX: Presence of dental prosthetic device (complete) (partial): Z97.2

## 2018-07-11 HISTORY — DX: Personal history of peptic ulcer disease: Z87.11

## 2018-07-11 HISTORY — DX: Presence of dental prosthetic device (complete) (partial): K08.109

## 2018-07-11 HISTORY — DX: Hereditary and idiopathic neuropathy, unspecified: G60.9

## 2018-07-11 HISTORY — DX: Furuncle of buttock: L02.32

## 2018-07-11 HISTORY — DX: Gout, unspecified: M10.9

## 2018-07-11 HISTORY — DX: Diaphragmatic hernia without obstruction or gangrene: K44.9

## 2018-07-11 HISTORY — DX: Other complications of anesthesia, initial encounter: T88.59XA

## 2018-07-11 LAB — CBC
HCT: 40.9 % (ref 36.0–46.0)
Hemoglobin: 12.5 g/dL (ref 12.0–15.0)
MCH: 28.7 pg (ref 26.0–34.0)
MCHC: 30.6 g/dL (ref 30.0–36.0)
MCV: 93.8 fL (ref 80.0–100.0)
Platelets: 207 10*3/uL (ref 150–400)
RBC: 4.36 MIL/uL (ref 3.87–5.11)
RDW: 14.8 % (ref 11.5–15.5)
WBC: 6.3 10*3/uL (ref 4.0–10.5)
nRBC: 0 % (ref 0.0–0.2)

## 2018-07-11 LAB — BASIC METABOLIC PANEL
Anion gap: 10 (ref 5–15)
BUN: 33 mg/dL — ABNORMAL HIGH (ref 8–23)
CO2: 25 mmol/L (ref 22–32)
Calcium: 9.3 mg/dL (ref 8.9–10.3)
Chloride: 106 mmol/L (ref 98–111)
Creatinine, Ser: 1.01 mg/dL — ABNORMAL HIGH (ref 0.44–1.00)
GFR calc Af Amer: >60 mL/min (ref >60)
GFR calc non Af Amer: 55 mL/min — ABNORMAL LOW (ref >60)
Glucose, Bld: 120 mg/dL — ABNORMAL HIGH (ref 70–99)
Potassium: 4 mmol/L (ref 3.5–5.1)
Sodium: 141 mmol/L (ref 135–145)

## 2018-07-11 LAB — GLUCOSE, CAPILLARY: Glucose-Capillary: 137 mg/dL — ABNORMAL HIGH (ref 70–99)

## 2018-07-11 LAB — TYPE AND SCREEN
ABO/RH(D): A POS
Antibody Screen: NEGATIVE

## 2018-07-11 LAB — ABO/RH: ABO/RH(D): A POS

## 2018-07-11 SURGERY — COLPORRHAPHY, POSTERIOR, FOR RECTOCELE REPAIR
Anesthesia: General | Site: Vagina

## 2018-07-11 MED ORDER — HYDROMORPHONE HCL 1 MG/ML IJ SOLN
INTRAMUSCULAR | Status: AC
  Filled 2018-07-11: qty 1

## 2018-07-11 MED ORDER — LIDOCAINE 2% (20 MG/ML) 5 ML SYRINGE
INTRAMUSCULAR | Status: AC
  Filled 2018-07-11: qty 5

## 2018-07-11 MED ORDER — PROPOFOL 10 MG/ML IV BOLUS
INTRAVENOUS | Status: AC
  Filled 2018-07-11: qty 20

## 2018-07-11 MED ORDER — HYDROMORPHONE HCL 1 MG/ML IJ SOLN
0.2000 mg | INTRAMUSCULAR | Status: DC | PRN
  Administered 2018-07-11: 0.5 mg via INTRAVENOUS
  Filled 2018-07-11: qty 1

## 2018-07-11 MED ORDER — DEXAMETHASONE SODIUM PHOSPHATE 10 MG/ML IJ SOLN
INTRAMUSCULAR | Status: AC
  Filled 2018-07-11: qty 1

## 2018-07-11 MED ORDER — CEFAZOLIN SODIUM-DEXTROSE 2-4 GM/100ML-% IV SOLN
2.0000 g | INTRAVENOUS | Status: AC
  Administered 2018-07-11: 14:00:00 2 g via INTRAVENOUS
  Filled 2018-07-11: qty 100

## 2018-07-11 MED ORDER — ONDANSETRON HCL 4 MG PO TABS
4.0000 mg | ORAL_TABLET | Freq: Four times a day (QID) | ORAL | Status: DC | PRN
  Filled 2018-07-11: qty 1

## 2018-07-11 MED ORDER — DEXAMETHASONE SODIUM PHOSPHATE 10 MG/ML IJ SOLN
INTRAMUSCULAR | Status: DC | PRN
  Administered 2018-07-11: 5 mg via INTRAVENOUS

## 2018-07-11 MED ORDER — FLUOXETINE HCL 20 MG PO CAPS
20.0000 mg | ORAL_CAPSULE | Freq: Every day | ORAL | Status: DC
  Filled 2018-07-11: qty 1

## 2018-07-11 MED ORDER — LACTATED RINGERS IV SOLN
INTRAVENOUS | Status: DC
  Administered 2018-07-11: 50 mL/h via INTRAVENOUS
  Administered 2018-07-11: 16:00:00 1000 mL via INTRAVENOUS
  Filled 2018-07-11: qty 1000

## 2018-07-11 MED ORDER — ONDANSETRON HCL 4 MG/2ML IJ SOLN
4.0000 mg | Freq: Four times a day (QID) | INTRAMUSCULAR | Status: DC | PRN
  Administered 2018-07-11: 16:00:00 4 mg via INTRAVENOUS
  Filled 2018-07-11: qty 2

## 2018-07-11 MED ORDER — ONDANSETRON HCL 4 MG/2ML IJ SOLN
INTRAMUSCULAR | Status: DC | PRN
  Administered 2018-07-11: 4 mg via INTRAVENOUS

## 2018-07-11 MED ORDER — ONDANSETRON HCL 4 MG/2ML IJ SOLN
4.0000 mg | Freq: Once | INTRAMUSCULAR | Status: DC | PRN
  Filled 2018-07-11: qty 2

## 2018-07-11 MED ORDER — FLUORESCEIN SODIUM 10 % IV SOLN
INTRAVENOUS | Status: AC
  Filled 2018-07-11: qty 5

## 2018-07-11 MED ORDER — LIDOCAINE 2% (20 MG/ML) 5 ML SYRINGE
INTRAMUSCULAR | Status: DC | PRN
  Administered 2018-07-11: 60 mg via INTRAVENOUS

## 2018-07-11 MED ORDER — KETOROLAC TROMETHAMINE 30 MG/ML IJ SOLN
INTRAMUSCULAR | Status: AC
  Filled 2018-07-11: qty 1

## 2018-07-11 MED ORDER — OXYCODONE-ACETAMINOPHEN 5-325 MG PO TABS
ORAL_TABLET | ORAL | Status: AC
  Filled 2018-07-11: qty 1

## 2018-07-11 MED ORDER — MENTHOL 3 MG MT LOZG
1.0000 | LOZENGE | OROMUCOSAL | Status: DC | PRN
  Filled 2018-07-11: qty 9

## 2018-07-11 MED ORDER — EPHEDRINE SULFATE-NACL 50-0.9 MG/10ML-% IV SOSY
PREFILLED_SYRINGE | INTRAVENOUS | Status: DC | PRN
  Administered 2018-07-11: 15 mg via INTRAVENOUS
  Administered 2018-07-11: 10 mg via INTRAVENOUS

## 2018-07-11 MED ORDER — OXYCODONE-ACETAMINOPHEN 5-325 MG PO TABS
1.0000 | ORAL_TABLET | ORAL | Status: DC | PRN
  Administered 2018-07-11 (×2): 1 via ORAL
  Administered 2018-07-12 (×2): 2 via ORAL
  Filled 2018-07-11: qty 2

## 2018-07-11 MED ORDER — FENTANYL CITRATE (PF) 100 MCG/2ML IJ SOLN
25.0000 ug | INTRAMUSCULAR | Status: DC | PRN
  Administered 2018-07-11 (×2): 25 ug via INTRAVENOUS
  Filled 2018-07-11: qty 1

## 2018-07-11 MED ORDER — PROPOFOL 10 MG/ML IV BOLUS
INTRAVENOUS | Status: DC | PRN
  Administered 2018-07-11: 150 mg via INTRAVENOUS

## 2018-07-11 MED ORDER — ESTRADIOL 0.1 MG/GM VA CREA
TOPICAL_CREAM | VAGINAL | Status: DC | PRN
  Administered 2018-07-11: 1 via VAGINAL

## 2018-07-11 MED ORDER — FENTANYL CITRATE (PF) 100 MCG/2ML IJ SOLN
INTRAMUSCULAR | Status: DC | PRN
  Administered 2018-07-11 (×2): 50 ug via INTRAVENOUS

## 2018-07-11 MED ORDER — ONDANSETRON HCL 4 MG/2ML IJ SOLN
INTRAMUSCULAR | Status: AC
  Filled 2018-07-11: qty 2

## 2018-07-11 MED ORDER — FENTANYL CITRATE (PF) 100 MCG/2ML IJ SOLN
INTRAMUSCULAR | Status: AC
  Filled 2018-07-11: qty 2

## 2018-07-11 MED ORDER — CEFAZOLIN SODIUM-DEXTROSE 2-4 GM/100ML-% IV SOLN
INTRAVENOUS | Status: AC
  Filled 2018-07-11: qty 100

## 2018-07-11 MED ORDER — HYDROCODONE-ACETAMINOPHEN 5-325 MG PO TABS
1.0000 | ORAL_TABLET | Freq: Four times a day (QID) | ORAL | Status: DC | PRN
  Filled 2018-07-11: qty 1

## 2018-07-11 MED ORDER — GUAIFENESIN 100 MG/5ML PO SOLN
15.0000 mL | ORAL | Status: DC | PRN
  Filled 2018-07-11: qty 15

## 2018-07-11 MED ORDER — PANTOPRAZOLE SODIUM 40 MG PO TBEC
40.0000 mg | DELAYED_RELEASE_TABLET | Freq: Every day | ORAL | Status: DC
  Filled 2018-07-11: qty 1

## 2018-07-11 MED ORDER — BUPIVACAINE-EPINEPHRINE 0.5% -1:200000 IJ SOLN
INTRAMUSCULAR | Status: DC | PRN
  Administered 2018-07-11: 20 mL

## 2018-07-11 MED ORDER — KETAMINE HCL 10 MG/ML IJ SOLN
INTRAMUSCULAR | Status: DC | PRN
  Administered 2018-07-11: 25 mg via INTRAVENOUS

## 2018-07-11 MED ORDER — ALLOPURINOL 100 MG PO TABS
100.0000 mg | ORAL_TABLET | Freq: Every day | ORAL | Status: DC
  Filled 2018-07-11: qty 1

## 2018-07-11 MED ORDER — EPHEDRINE 5 MG/ML INJ
INTRAVENOUS | Status: AC
  Filled 2018-07-11: qty 10

## 2018-07-11 SURGICAL SUPPLY — 27 items
BNDG GAUZE ELAST 4 BULKY (GAUZE/BANDAGES/DRESSINGS) ×3 IMPLANT
CANISTER SUCT 3000ML PPV (MISCELLANEOUS) ×3 IMPLANT
DECANTER SPIKE VIAL GLASS SM (MISCELLANEOUS) ×3 IMPLANT
DEVICE CAPIO SLIM SINGLE (INSTRUMENTS) IMPLANT
GAUZE PACKING 1 X5 YD ST (GAUZE/BANDAGES/DRESSINGS) IMPLANT
GLOVE BIO SURGEON STRL SZ 6.5 (GLOVE) ×10 IMPLANT
GLOVE BIO SURGEONS STRL SZ 6.5 (GLOVE) ×5
GLOVE BIOGEL PI IND STRL 7.0 (GLOVE) ×5 IMPLANT
GLOVE BIOGEL PI INDICATOR 7.0 (GLOVE) ×10
GOWN STRL REUS W/ TWL LRG LVL3 (GOWN DISPOSABLE) ×4 IMPLANT
GOWN STRL REUS W/TWL LRG LVL3 (GOWN DISPOSABLE) ×8
HIBICLENS CHG 4% 4OZ BTL (MISCELLANEOUS) IMPLANT
HOLDER FOLEY CATH W/STRAP (MISCELLANEOUS) ×3 IMPLANT
KIT TURNOVER KIT B (KITS) ×3 IMPLANT
NEEDLE HYPO 22GX1.5 SAFETY (NEEDLE) IMPLANT
NS IRRIG 1000ML POUR BTL (IV SOLUTION) ×3 IMPLANT
PACK VAGINAL WOMENS (CUSTOM PROCEDURE TRAY) ×3 IMPLANT
SURGILUBE 2OZ TUBE FLIPTOP (MISCELLANEOUS) ×3 IMPLANT
SUT CAPIO ETHIBPND (SUTURE) IMPLANT
SUT VIC AB 0 UR5 27 (SUTURE) ×3 IMPLANT
SUT VIC AB 2-0 CT1 (SUTURE) ×12 IMPLANT
SUT VIC AB 2-0 CT1 27 (SUTURE)
SUT VIC AB 2-0 CT1 TAPERPNT 27 (SUTURE) IMPLANT
SUT VIC AB 2-0 UR5 27 (SUTURE) IMPLANT
SYR 10ML LL (SYRINGE) IMPLANT
SYR BULB IRRIGATION 50ML (SYRINGE) ×3 IMPLANT
TRAY FOLEY W/BAG SLVR 14FR (SET/KITS/TRAYS/PACK) ×3 IMPLANT

## 2018-07-11 NOTE — Transfer of Care (Signed)
Immediate Anesthesia Transfer of Care Note  Patient: Morgan Pearson  Procedure(s) Performed: POSTERIOR REPAIR (RECTOCELE) (N/A Vagina )  Patient Location: PACU  Anesthesia Type:General  Level of Consciousness: awake, alert , oriented and patient cooperative  Airway & Oxygen Therapy: Patient Spontanous Breathing and Patient connected to nasal cannula oxygen  Post-op Assessment: Report given to RN and Post -op Vital signs reviewed and stable  Post vital signs: Reviewed and stable  Last Vitals:  Vitals Value Taken Time  BP    Temp    Pulse    Resp    SpO2      Last Pain:  Vitals:   07/11/18 1200  TempSrc:   PainSc: 7       Patients Stated Pain Goal: 6 ([id number])  Complications: No apparent anesthesia complications

## 2018-07-11 NOTE — H&P (Signed)
Chief complaint: Rectocele  History of present illness: 73 year old G4, P4 status post vaginal hysterectomy and prior anterior repair with mesh presents with worsening vaginal bulge.  Patient notes difficult to see having bowel movements and needing to splint.  Patient notes palpable vaginal bulge that is not always able to be manually replaced.  This causes significant stress and discomfort to the patient.  Patient also notes urinary frequency with incomplete emptying.  Patient has continued symptoms and unable to have a properly fitting pessary, either due to pain or continued expulsion.  Patient has had several different pessaries and none provide a satisfactory solution.  Past Medical History:  Diagnosis Date  . Boil of buttock    07-09-2018  per pt bilateral buttock,  healed  . Borderline diabetes    followed by pcp,  watches diet  . Chronic low back pain   . Complication of anesthesia    per pt hx hypotension post op in 1970s surgery , but no issues since  . Full dentures   . GERD (gastroesophageal reflux disease)   . Gout    07-09-2018  per pt last flare 2018  great toes  . Hiatal hernia   . History of angina    07-09-2018  per pt hx angina few yrs ago last episode w/ severe hypertension approx. 2016, saw cardiologist @[facility] in [facility], work-up done was told heart was fine no cause found for angina,; has not seen cardiologist since and no episode of angina since 2016  . History of gastric ulcer 2004 approx.  . Hypertension   . Idiopathic neuropathy    feet  . OA (osteoarthritis)    hands  . Rectocele     Past Surgical History:  Procedure Laterality Date  . APPENDECTOMY  1970  . BLADDER SURGERY  2011   cystocele and enterocele repairs  . CARDIAC CATHETERIZATION  2010  @ [facility]   mild nonobstructive cad  . CATARACT EXTRACTION W/ INTRAOCULAR LENS  IMPLANT, BILATERAL  2018  . CHOLECYSTECTOMY OPEN  1976  . COMBINED AUGMENTATION MAMMAPLASTY AND ABDOMINOPLASTY  Bilateral 1999   implants  . INCONTINENCE SURGERY  2013  . KNEE ARTHROSCOPY Right 11-12-2014   @[facility]   . NASAL SINUS SURGERY  1985  . ROTATOR CUFF REPAIR Left 2015  APPROX.  . TONSILLECTOMY  age 73  . TOTAL KNEE ARTHROPLASTY Bilateral left  2010;   05-11-2015  . TUBAL LIGATION Bilateral yrs ago  . UMBILICAL HERNIA REPAIR  1996  . VAGINAL HYSTERECTOMY  age 73   few hours later per pt had EXPLORATORY LAPAROTOMY TO REPAIR ARTERY INJURY    Medications: Multiple: See list  No known drug allergies  Physical exam: Vitals:   07/09/18 1704 07/11/18 1120  BP:  (!) 142/87  Pulse:  71  Resp:  16  Temp:  98 F (36.7 C)  TempSrc:  Oral  SpO2:  96%  Weight: 90.3 kg 92.2 kg  Height: 5\' 6"  (1.676 m)    General: Well-appearing, no distress Abdomen: Obese, nontender, nondistended GU: Deferred to the OR Lower extremity: Nontender, no edema  Labs pending  Assessment plan 73 year old G4, P4 with pelvic organ prolapse.  Patient is status post anterior vaginal wall surgery x2 once with mesh.  Patient now with worsening rectocele.  Patient understands high risk of recurrence but given inability to properly fit pessary patient wants attempt at rectocele repair.  Colpocleisis has been discussed with patient declined referral to urogynecology.  Given risk of dyschezia with mesh  will plan to not use mesh.  Given adequate bladder support we will not plan any additional anterior wall surgery.  Patient aware risks of recurrence, bleeding, dyspareunia and dyschezia.  Plan overnight admission given age and chronical medical problems  Ala Dach 07/11/2018 12:07 PM

## 2018-07-11 NOTE — Brief Op Note (Signed)
07/11/2018  2:45 PM  PATIENT:  Morgan Pearson  73 y.o. female  PRE-OPERATIVE DIAGNOSIS:  Rectocele  POST-OPERATIVE DIAGNOSIS:  Rectocele  PROCEDURE:  Procedure(s) with comments: POSTERIOR REPAIR (RECTOCELE) (N/A) - Requests 90 min. Bed for 23hr obs TS RNFA  Perineorrhaphy  SURGEON:  Surgeon(s) and Role:    * Aloha Gell, MD - Primary  PHYSICIAN ASSISTANT: Surgical assistant  ASSISTANTS: RNFA  ANESTHESIA:   local and general  EBL: 5 cc    BLOOD ADMINISTERED:none  DRAINS: Urinary Catheter (Foley)   LOCAL MEDICATIONS USED:  MARCAINE 1/4% with 1 200,000 units epinephrine, 20 cc used  SPECIMEN:  Source of Specimen:  None  DISPOSITION OF SPECIMEN:  NA, redundant vaginal tissue disposed  COUNTS:  YES  TOURNIQUET:  * No tourniquets in log *  DICTATION: .Dragon Dictation  PLAN OF CARE: Admit for overnight observation  PATIENT DISPOSITION:  PACU - hemodynamically stable.   Delay start of Pharmacological VTE agent (>24hrs) due to surgical blood loss or risk of bleeding: yes

## 2018-07-11 NOTE — Op Note (Signed)
07/11/2018  2:45 PM  PATIENT:  Morgan Pearson  73 y.o. female  PRE-OPERATIVE DIAGNOSIS:  Rectocele  POST-OPERATIVE DIAGNOSIS:  Rectocele  PROCEDURE:  Procedure(s) with comments: POSTERIOR REPAIR (RECTOCELE) (N/A) - Requests 90 min. Bed for 23hr obs TS RNFA  Perineorrhaphy  SURGEON:  Surgeon(s) and Role:    * Aloha Gell, MD - Primary  PHYSICIAN ASSISTANT: Surgical assistant  ASSISTANTS: RNFA  ANESTHESIA:   local and general  EBL: 5 cc    BLOOD ADMINISTERED:none  DRAINS: Urinary Catheter (Foley)   LOCAL MEDICATIONS USED:  MARCAINE 1/4% with 1 200,000 units epinephrine, 20 cc used  SPECIMEN:  Source of Specimen:  None  DISPOSITION OF SPECIMEN:  NA, redundant vaginal tissue disposed  COUNTS:  YES  TOURNIQUET:  * No tourniquets in log *  DICTATION: .Dragon Dictation  PLAN OF CARE: Admit for overnight observation  PATIENT DISPOSITION:  PACU - hemodynamically stable.   Delay start of Pharmacological VTE agent (>24hrs) due to surgical blood loss or risk of bleeding: yes  Indications: 73 year old G4, P4 with prior vaginal hysterectomy and prior anterior repair x2 now with new bothersome rectocele.  Patient failed trial of pessary and declined urogynecology consult for consideration of vault suspension.  Patient most interested in rectocele repair despite awareness of high recurrence rate.  Findings: Atrophic vagina, adequate anterior wall support with mesh palpated, status post hysterectomy, grade 3 rectocele with defect extending to apex of vaginal cuff, no apparent enterocele.  Antibiotics: 2 g of Ancef Procedure: After informed that was obtained the patient was taken the operating room where general anesthesia was initiated she was prepped and draped in the normal sterile fashion and placed in high lithotomy position.  A Foley catheter was placed.  2 Allis clamps were placed at 5 and 7:00 along the introitus.  A third Allis was placed at the apex of the rectocele  high in the vagina.  Using the Bovie cautery on cut setting a series of superficial burns were placed along the midpoint of the vagina from the introitus up to the apex of the rectocele to serve as a midline guide.  Marcaine and epinephrine were infused underneath the vaginal mucosa along this guideline to the apex of the vagina.  Injection was also directed laterally to aid in the dissection.  Additional injection was placed from the introitus underneath the epithelial skin for about 2 cm towards the rectum.  A triangular-shaped incision was made between the 2 Allis clamps at 5 and 7:00 with the point of the triangle towards the rectum.  The epithelial layer was removed.  Starting at the introitus and directing towards the Allis clamp high in the vagina, and along the guideline the vaginal mucosa was undermined and then transected.  Allis clamps were placed on the edges of the incision.  Using a combination of Metzenbaum scissors,Strulli  scissors and blunt dissection the vaginal mucosa was dissected away from the underlying rectocele.  Of note very limited connective tissue was encountered during the dissection.  Intermittently through the dissection a finger was placed in the rectum to ensure no entry into the rectum.  Gloves were changed intermittently and irrigation was used.  Once the rectocele was completely exposed, 2-0 Vicryl interrupted sutures were placed on the lateral edges of the rectovaginal mucosa and plicated in the midline.  This led to good closure of the rectocele defect.  The excess vaginal skin was transected with a Mayo scissor.  The perineorrhaphy was begun.  Trying to identify the perineal  body a 2-0 Vicryl suture was used first to grasp the perineal body in a horizontal plane and then a second suture in a vertical plane.  These were then tied to give additional support to the perineal body.  The vaginal coast was closed with a running 2-0 Vicryl.  Good hemostasis was noted.  At the end of  the case the rectocele was markedly reduced.  The perineal body had improved strength.  However the apex of the posterior vagina continued to exhibit some descent.  Palpating rectally this apical descent did not contain protruding rectocele tissue.  My feeling at the conclusion of the procedure was that should persistent prolapse be identified postoperatively patient would need a suspensory procedure for improved support of the vaginal cuff.  I will share this information with the patient postoperatively.  Estrace cream was placed in the vagina and a packing with gauze was done.  Sponge lap needle count was correct x3 patient was taken the recovery room in stable condition.  Morgan Pearson 07/11/2018 3:03 PM

## 2018-07-11 NOTE — Anesthesia Procedure Notes (Signed)
Procedure Name: LMA Insertion Date/Time: 07/11/2018 1:37 PM Performed by: Wanita Chamberlain, CRNA Pre-anesthesia Checklist: Patient identified, Emergency Drugs available, Suction available and Patient being monitored Patient Re-evaluated:Patient Re-evaluated prior to induction Oxygen Delivery Method: Circle system utilized Preoxygenation: Pre-oxygenation with 100% oxygen Induction Type: IV induction Ventilation: Mask ventilation without difficulty LMA: LMA inserted LMA Size: 4.0 Number of attempts: 1 Placement Confirmation: breath sounds checked- equal and bilateral,  CO2 detector and positive ETCO2 Tube secured with: Tape Dental Injury: Teeth and Oropharynx as per pre-operative assessment

## 2018-07-11 NOTE — Anesthesia Preprocedure Evaluation (Addendum)
Anesthesia Evaluation  Patient identified by MRN, date of birth, ID band Patient awake    Reviewed: Allergy & Precautions, NPO status , Patient's Chart, lab work & pertinent test results  Airway Mallampati: II  TM Distance: >3 FB     Dental  (+) Dental Advisory Given, Lower Dentures, Upper Dentures   Pulmonary former smoker,    breath sounds clear to auscultation       Cardiovascular hypertension, Pt. on medications + CAD   Rhythm:Regular Rate:Normal     Neuro/Psych Anxiety Depression negative neurological ROS     GI/Hepatic Neg liver ROS, hiatal hernia, GERD  Medicated and Controlled,  Endo/Other  negative endocrine ROS  Renal/GU negative Renal ROS     Musculoskeletal  (+) Arthritis , Osteoarthritis,    Abdominal   Peds  Hematology negative hematology ROS (+)   Anesthesia Other Findings Chronic narcotic use for back pain  Reproductive/Obstetrics                           Lab Results  Component Value Date   HGB 12.2 12/18/2015   HCT 36.0 12/18/2015   Lab Results  Component Value Date   CREATININE 1.50 (H) 12/18/2015   BUN 17 12/18/2015   NA 143 12/18/2015   K 4.0 12/18/2015   CL 103 12/18/2015    Anesthesia Physical Anesthesia Plan  ASA: II  Anesthesia Plan: General   Post-op Pain Management:    Induction: Intravenous  PONV Risk Score and Plan: 3 and Dexamethasone, Ondansetron and Treatment may vary due to age or medical condition  Airway Management Planned: Oral ETT  Additional Equipment:   Intra-op Plan:   Post-operative Plan: Extubation in OR  Informed Consent: I have reviewed the patients History and Physical, chart, labs and discussed the procedure including the risks, benefits and alternatives for the proposed anesthesia with the patient or authorized representative who has indicated his/her understanding and acceptance.     Dental advisory given  Plan  Discussed with: CRNA  Anesthesia Plan Comments:         Anesthesia Quick Evaluation

## 2018-07-12 ENCOUNTER — Encounter (HOSPITAL_BASED_OUTPATIENT_CLINIC_OR_DEPARTMENT_OTHER): Payer: Self-pay | Admitting: Obstetrics

## 2018-07-12 DIAGNOSIS — N816 Rectocele: Secondary | ICD-10-CM | POA: Diagnosis not present

## 2018-07-12 MED ORDER — OXYCODONE-ACETAMINOPHEN 5-325 MG PO TABS: 1.0000 | ORAL_TABLET | ORAL | 0 refills | Status: AC | PRN

## 2018-07-12 MED ORDER — OXYCODONE-ACETAMINOPHEN 5-325 MG PO TABS
ORAL_TABLET | ORAL | Status: AC
  Filled 2018-07-12: qty 2

## 2018-07-12 NOTE — Anesthesia Postprocedure Evaluation (Signed)
Anesthesia Post Note  Patient: Morgan Pearson  Procedure(s) Performed: POSTERIOR REPAIR (RECTOCELE) (N/A Vagina )     Patient location during evaluation: PACU Anesthesia Type: General Level of consciousness: awake and sedated Pain management: pain level controlled Vital Signs Assessment: post-procedure vital signs reviewed and stable Respiratory status: spontaneous breathing Cardiovascular status: stable Postop Assessment: no apparent nausea or vomiting Anesthetic complications: no    Last Vitals:  Vitals:   07/12/18 0530 07/12/18 0850  BP: 122/61 136/64  Pulse: 60 69  Resp: 17   Temp: 36.4 C 36.4 C  SpO2: 97% 100%    Last Pain:  Vitals:   07/12/18 0850  TempSrc:   PainSc: 1    Pain Goal: Patients Stated Pain Goal: 6 (07/12/18 0054)                 Huston Foley

## 2018-07-12 NOTE — Discharge Summary (Signed)
Morgan Pearson MRN: [id number] DOB/AGE: 73-Sep-1947 73 y.o.  Admit date: 07/11/2018 Discharge date: July 12, 2018  Admission Diagnoses: Rectocele  Discharge Diagnoses: Rectocele        Active Problems:   Rectocele   Discharged Condition: good  Hospital Course: Patient admitted for scheduled surgery.  Surgery proceeded as planned.  Given patient's age and medical condition overnight observation was done.  Patient reports poor sleep overnight.  Single episode of pain for which she needed IV morphine.  Otherwise pain controlled with oral narcotics.  Of note patient is narcotic dependent from chronic back pain.  Foley catheter is removed in the morning.  Patient is still awaiting void.  Patient tolerated regular p.o.  Ambulating.  No chest pain, no shortness of breath.  On postop day #1. Vitals:   07/11/18 1729 07/11/18 2200 07/12/18 0054 07/12/18 0530  BP: 110/62 (!) 147/65 118/68 122/61  Pulse: 78 83 82 60  Resp: 16 16 16 17   Temp: 98 F (36.7 C) 97.8 F (36.6 C) 98.5 F (36.9 C) 97.6 F (36.4 C)  TempSrc:      SpO2: 96% 97% 96% 97%  Weight:      Height:       General: Well-appearing, sitting up in bed and able to move around the room Abdomen: Soft, nontender GU: Vaginal packing removed, appropriate staining.  No active bleeding.  Vaginally Cocet intact.  Rectocele reduced Lower extremity, nontender, no edema  Consults: None  Treatments: surgery: Rectocele repair and perineorrhaphy  Disposition: Discharge disposition: 01-Home or Self Care        Allergies as of 07/12/2018      Reactions   Adhesive [tape] Dermatitis      Medication List    TAKE these medications   allopurinol 100 MG tablet Commonly known as: ZYLOPRIM Take 100 mg by mouth daily.   aspirin EC 81 MG tablet Take 81 mg by mouth daily.   atorvastatin 40 MG tablet Commonly known as: LIPITOR Take 40 mg by mouth at bedtime.   celecoxib 100 MG capsule Commonly known as: CELEBREX Take 100 mg by  mouth daily.   FLUoxetine 20 MG capsule Commonly known as: PROZAC Take 20 mg by mouth daily.   HYDROcodone-acetaminophen 5-325 MG tablet Commonly known as: NORCO/VICODIN Take 1 tablet by mouth every 6 (six) hours as needed for moderate pain.   omeprazole 40 MG capsule Commonly known as: PRILOSEC Take 40 mg by mouth daily.   oxyCODONE-acetaminophen 5-325 MG tablet Commonly known as: PERCOCET/ROXICET Take 1-2 tablets by mouth every 4 (four) hours as needed for up to 7 days (moderate to severe pain (when tolerating fluids)).   phentermine 37.5 MG capsule Take 37.5 mg by mouth every morning.   triamterene-hydrochlorothiazide 37.5-25 MG tablet Commonly known as: MAXZIDE-25 Take 1 tablet by mouth daily.   VITAMIN D PO Take 1 tablet by mouth daily.        Signed: Ala Dach, MD 07/12/2018, 8:34 AM

## 2018-07-13 NOTE — Progress Notes (Signed)
0.5mg  of dilaudid syringe wasted in sink. Witnessed by Dory Larsen, RN. Lenna Sciara, RN

## 2018-08-09 ENCOUNTER — Encounter (HOSPITAL_BASED_OUTPATIENT_CLINIC_OR_DEPARTMENT_OTHER): Payer: Self-pay | Admitting: Obstetrics

## 2018-10-17 IMAGING — CR DG RIBS W/ CHEST 3+V*L*
3 series · 3 of 3 positions shown · non-contrast
Comparison: Prior radiograph from 01/24/2014.

CLINICAL DATA: Initial valuation for acute trauma, fall. Left upper
anterior rib pain.

EXAM:
LEFT RIBS AND CHEST - 3+ VIEW

[chest ap]
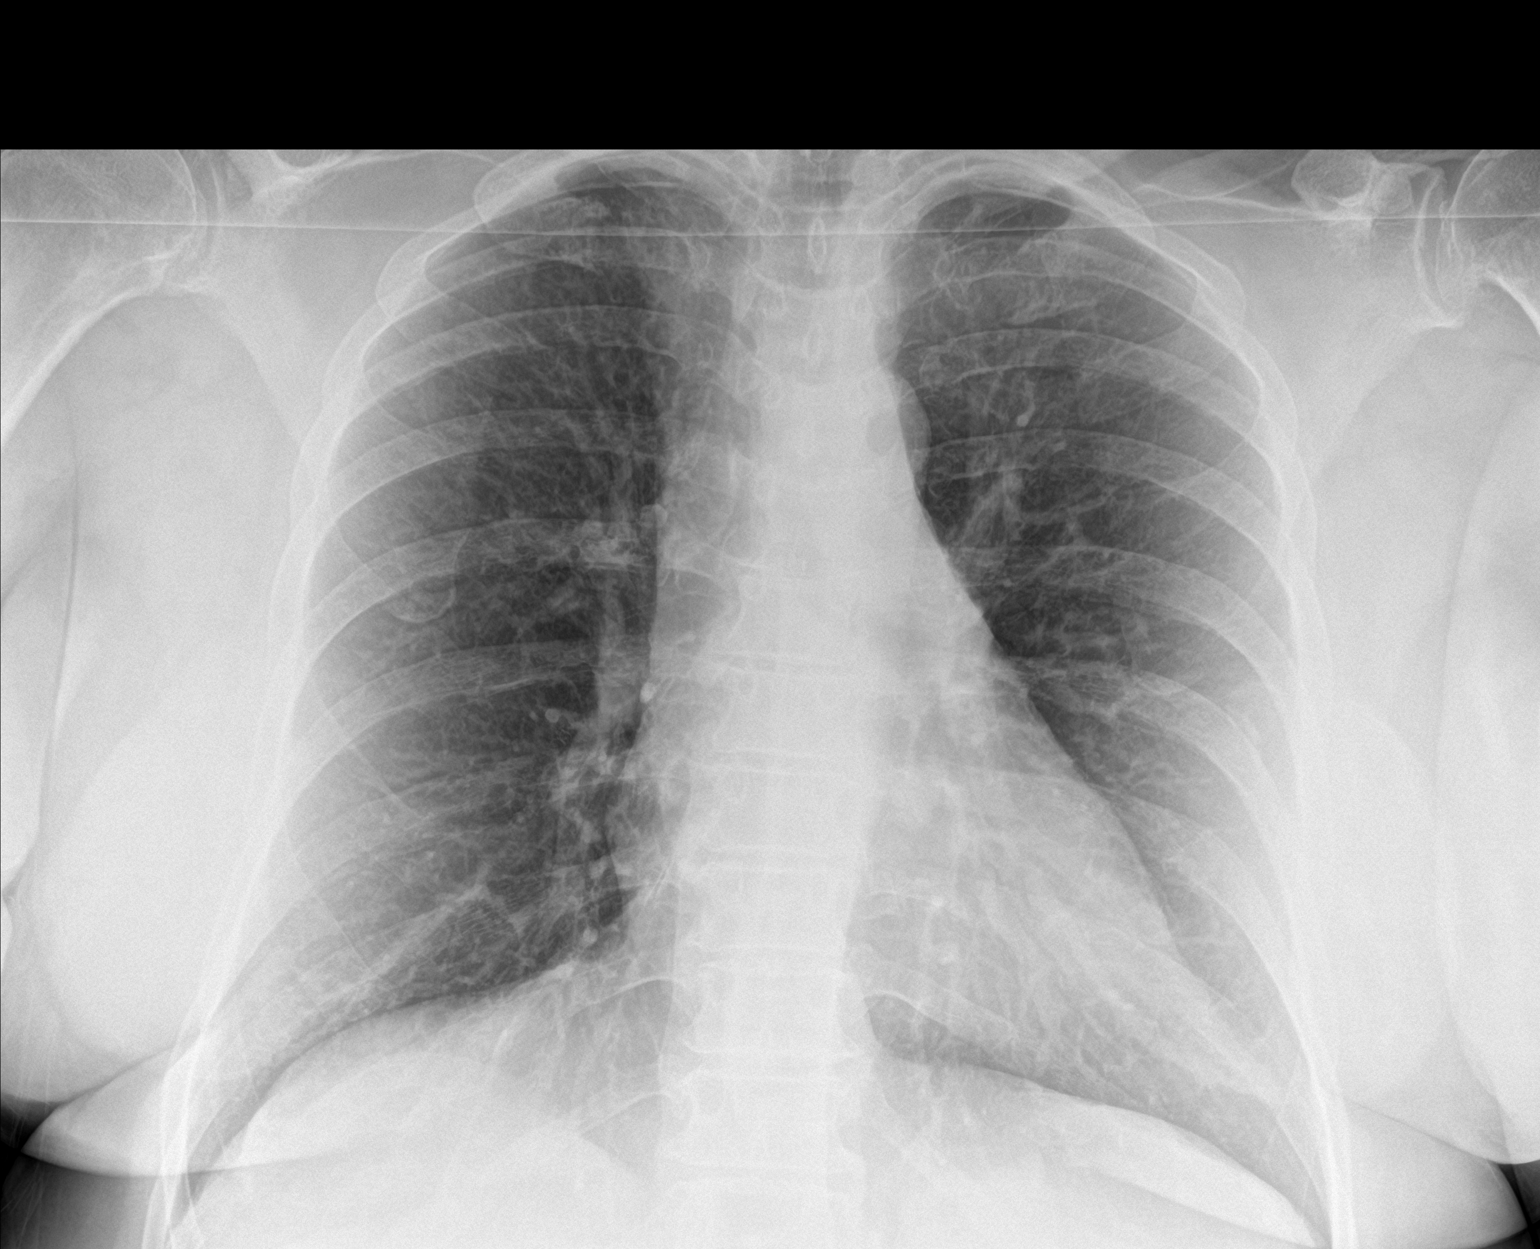

[rib ap]
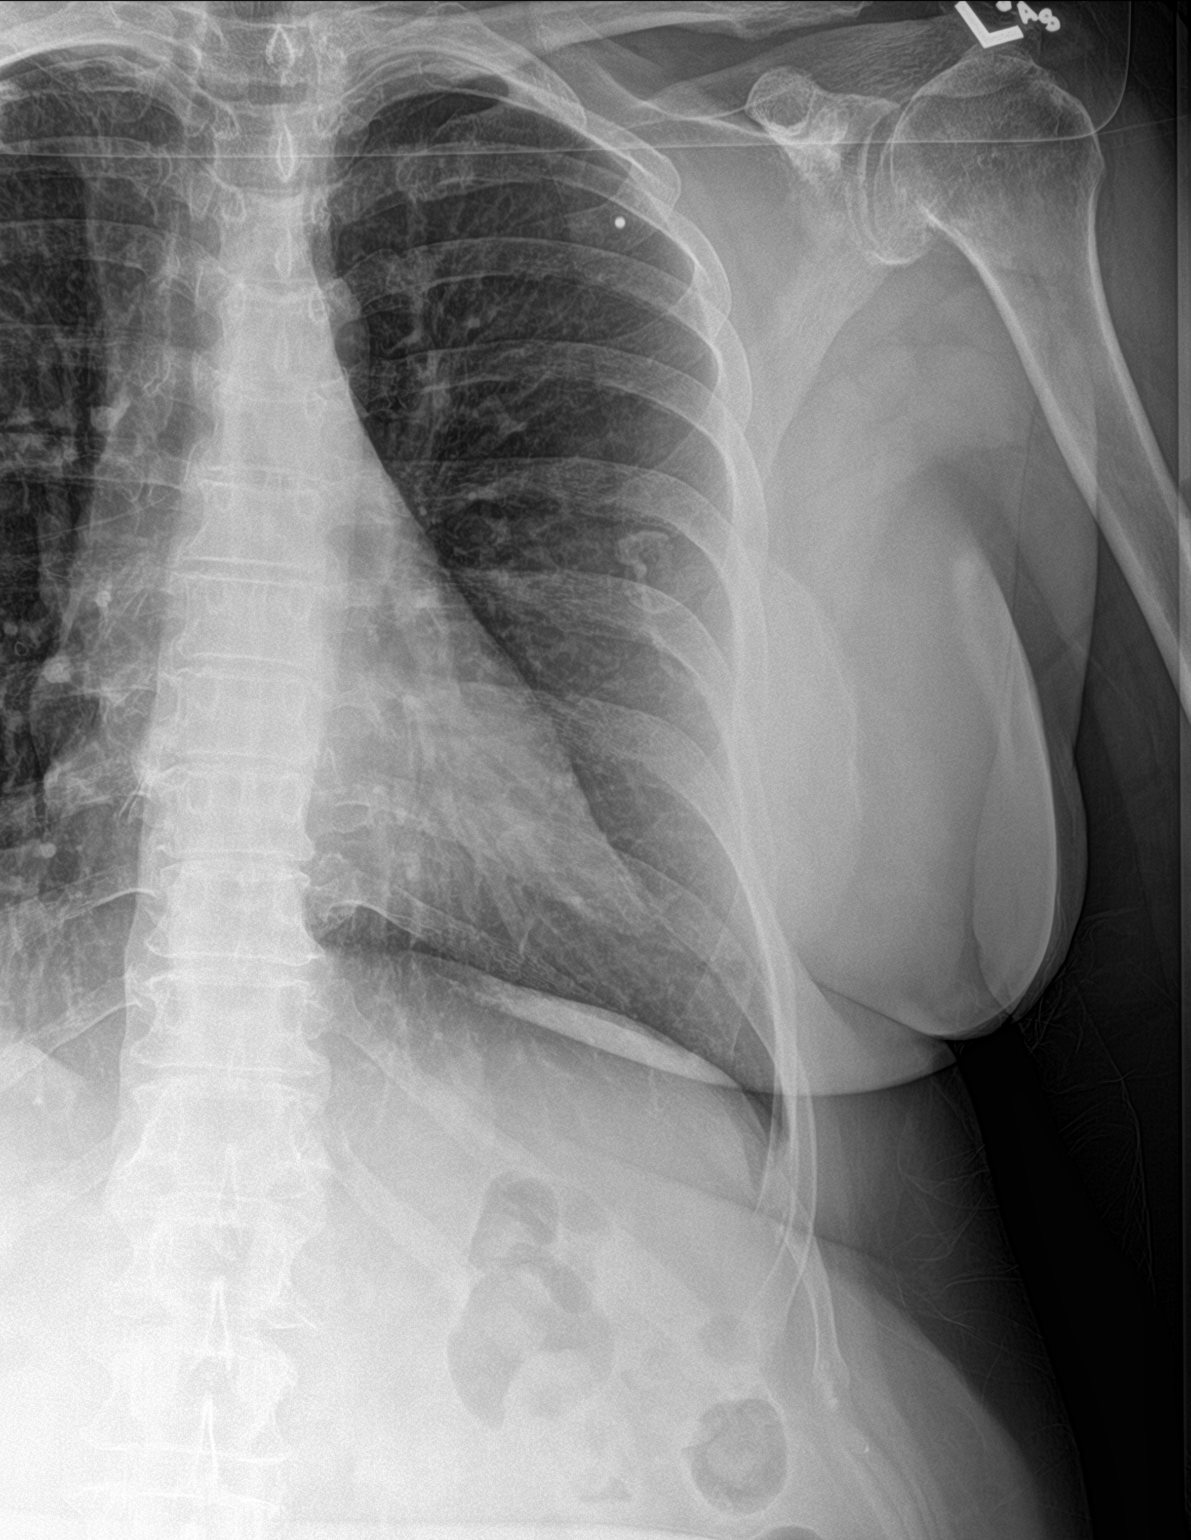

[rib ap obl]
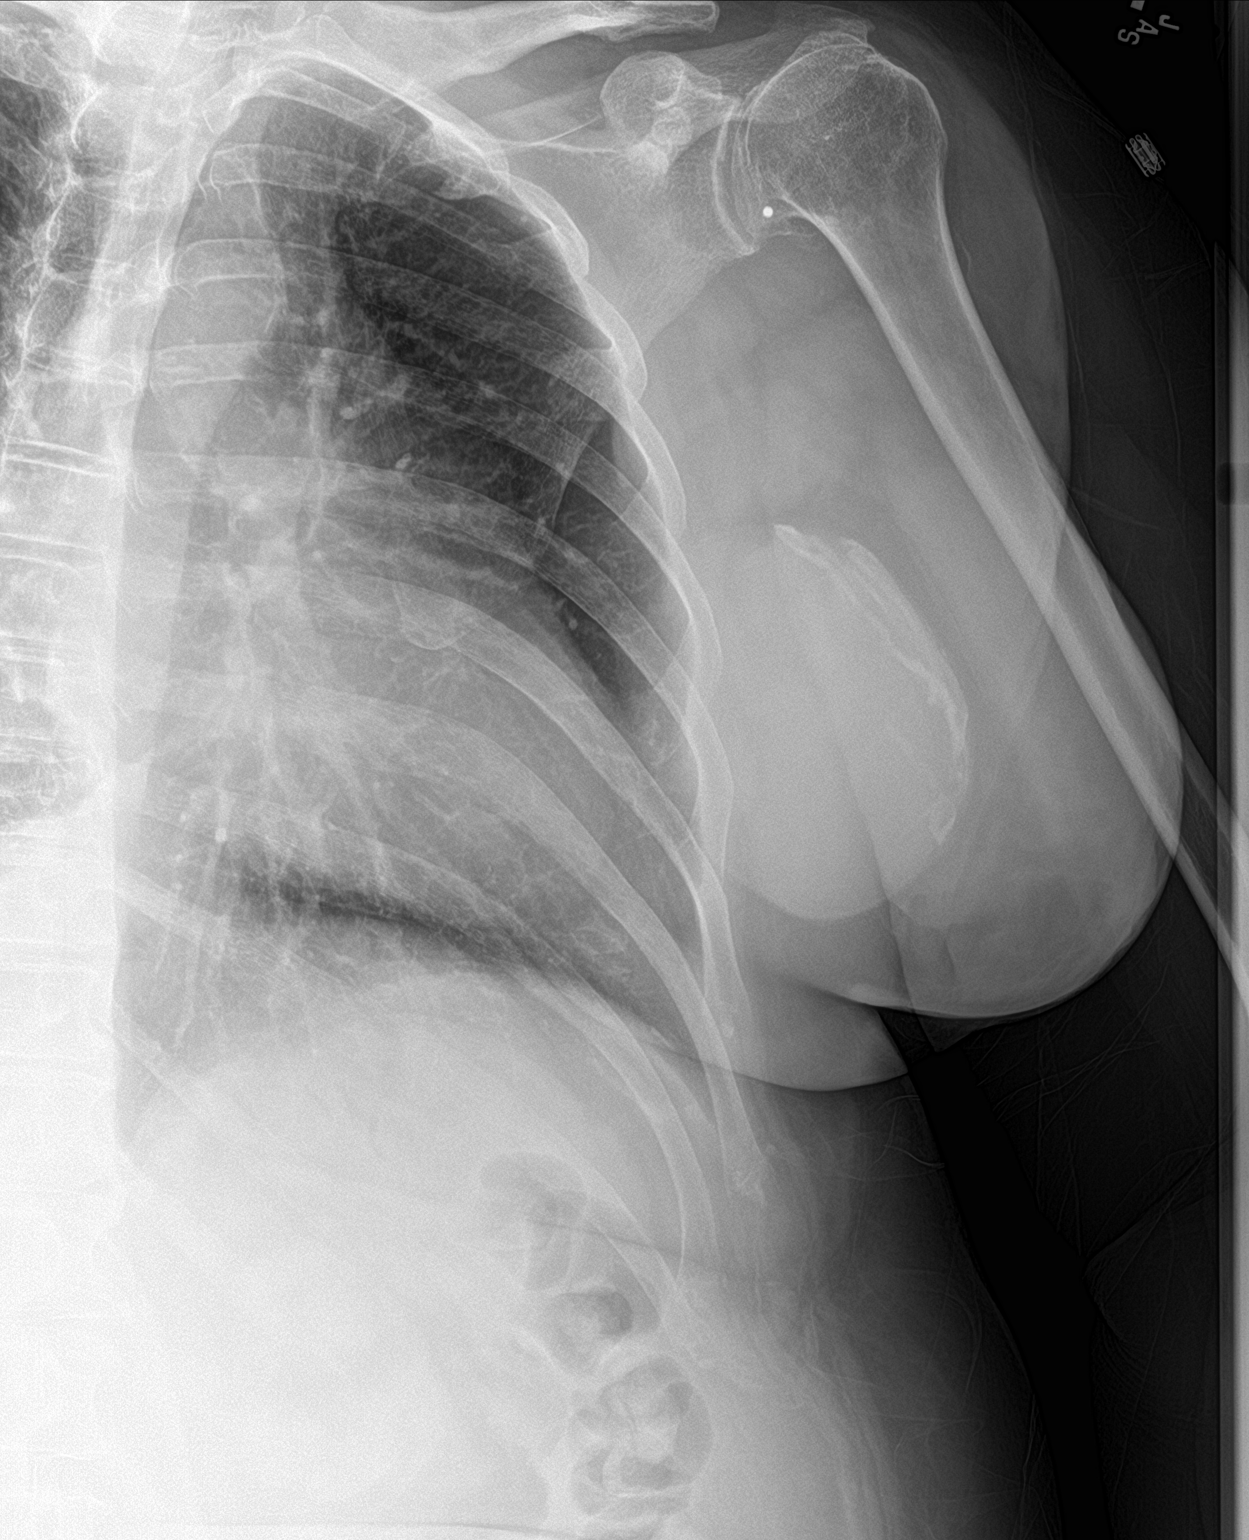

[3 of 3 positions shown; findings below may reference images not displayed]

FINDINGS: Cardiac and mediastinal silhouettes are stable in size and contour,
and remain within normal limits.

Lungs are normally inflated. No focal infiltrates. No pulmonary
edema or pleural effusion. No pneumothorax. Mild diffuse bronchitic
changes.

Dedicated views the left ribs demonstrate no acute displaced rib
fracture. No other acute osseous abnormality. Degenerative changes
noted about the partially visualized shoulders.
IMPRESSION: 1. No acute displaced rib fracture identified.
2. Mild diffuse bronchitic changes. No other active cardiopulmonary
disease.

## 2018-10-17 IMAGING — CR DG LUMBAR SPINE COMPLETE 4+V
5 series · 5 of 5 positions shown · non-contrast
Comparison: None.

CLINICAL DATA: Initial evaluation for acute trauma, fall.

EXAM:
LUMBAR SPINE - COMPLETE 4+ VIEW

[l-spine ap]
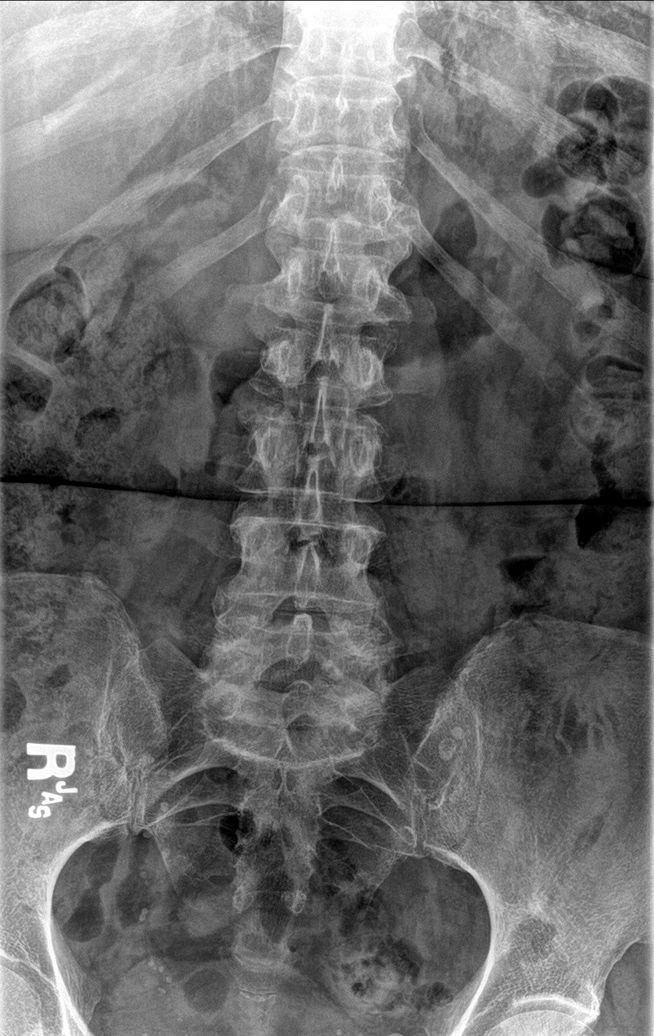

[l-spine obl (1 of 2)]
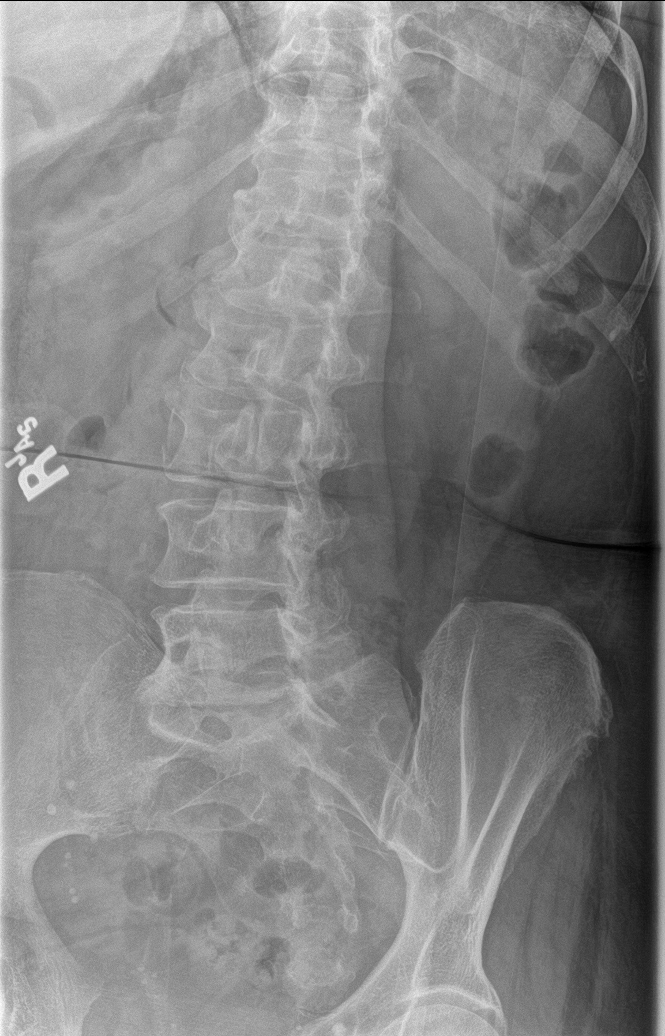

[l-spine obl (2 of 2)]
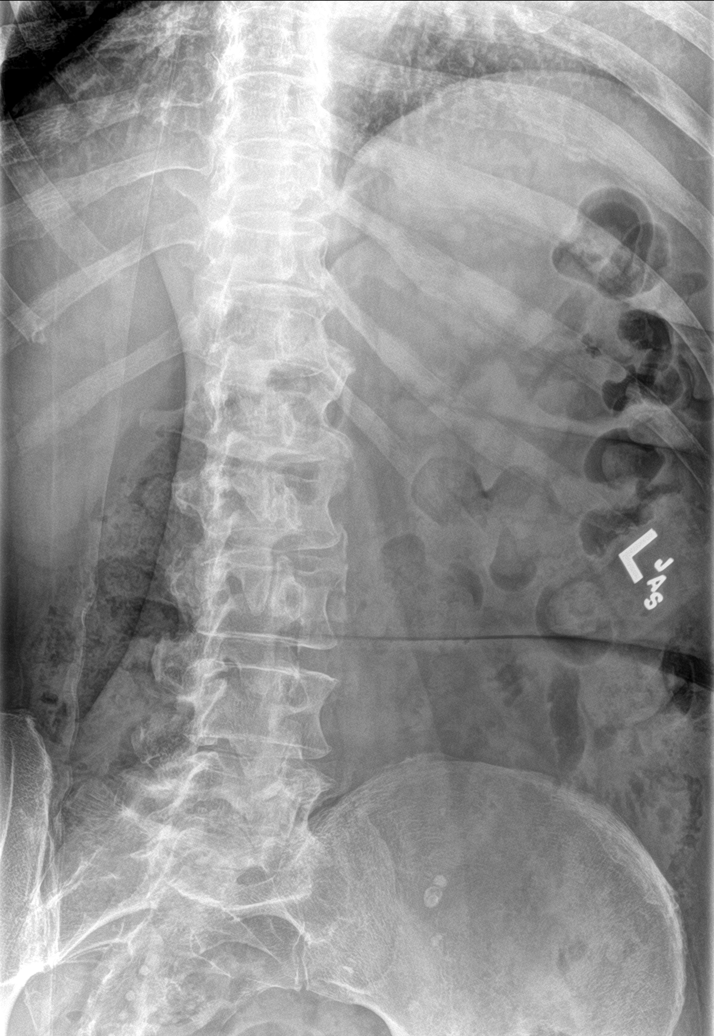

[l-spine lat]
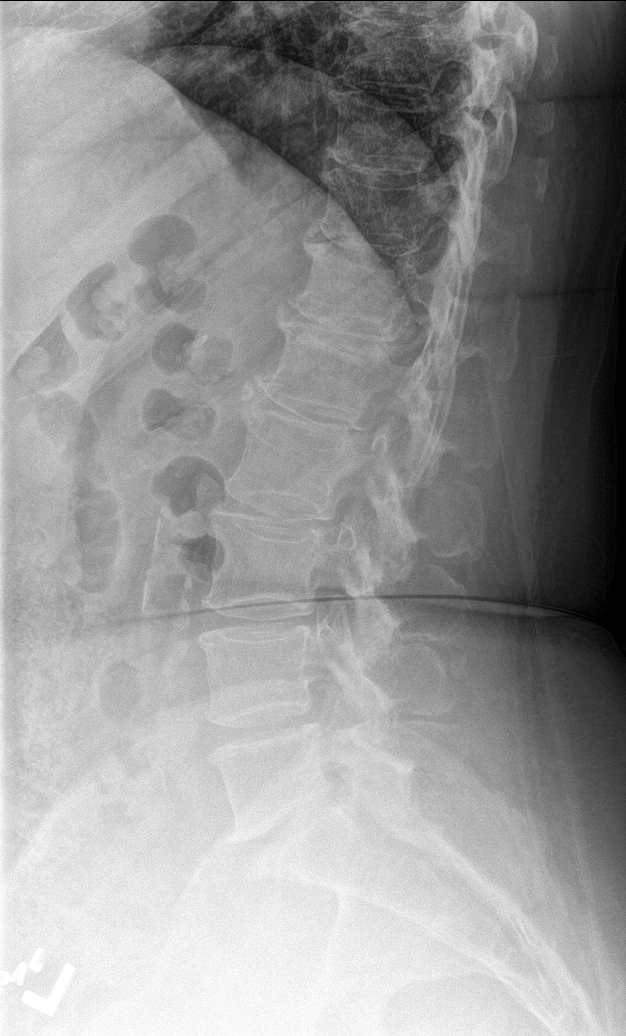

[l-spine spot]
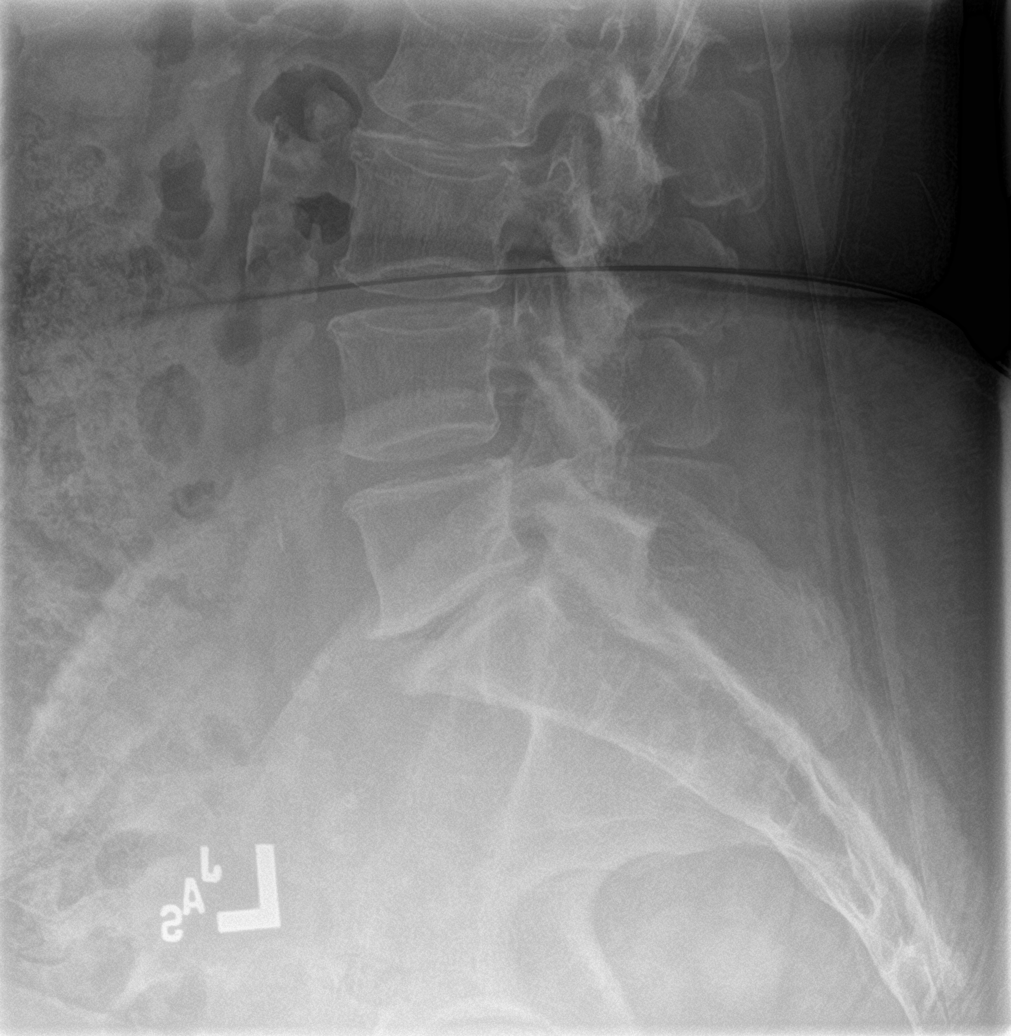

[5 of 5 positions shown; findings below may reference images not displayed]

FINDINGS: Vertebral bodies normally aligned with preservation of the normal
lumbar lordosis. Question mild height loss at the superior endplate
of L3, favored to be chronic/degenerative in nature. Vertebral body
heights are otherwise maintained. Moderate multilevel degenerative
spondylolysis, greatest at L5-S1. Bilateral facet arthrosis noted
throughout the lumbar spine.

No acute soft tissue abnormality.  Aortic atherosclerosis noted.
IMPRESSION: 1. Mild height loss at the superior endplate of L3, favored to be
chronic/degenerative in nature. Correlation with physical exam for
possible pain at this location recommended. Finding could be further
assessed with dedicated cross-sectional imaging of the lumbar spine
as indicated.
2. No other radiographic evidence for acute traumatic injury within
the lumbar spine.
3. Moderate multilevel degenerative spondylolysis, greatest at
L5-S1.
